# Patient Record
Sex: Female | Born: 1945 | Race: White | Hispanic: No | Smoking: Former smoker
Health system: Southern US, Community
[De-identification: ages and names within clinical notes are randomized; demographics above are authoritative.]

## PROBLEM LIST (undated history)

## (undated) DIAGNOSIS — I10 Essential (primary) hypertension: Secondary | ICD-10-CM

## (undated) DIAGNOSIS — R413 Other amnesia: Secondary | ICD-10-CM

## (undated) DIAGNOSIS — R2681 Unsteadiness on feet: Secondary | ICD-10-CM

## (undated) DIAGNOSIS — R519 Headache, unspecified: Secondary | ICD-10-CM

## (undated) DIAGNOSIS — R251 Tremor, unspecified: Secondary | ICD-10-CM

## (undated) DIAGNOSIS — J189 Pneumonia, unspecified organism: Secondary | ICD-10-CM

## (undated) HISTORY — PX: TONSILLECTOMY: SUR1361

## (undated) HISTORY — DX: Other amnesia: R41.3

## (undated) HISTORY — DX: Headache, unspecified: R51.9

## (undated) HISTORY — DX: Tremor, unspecified: R25.1

## (undated) HISTORY — DX: Unsteadiness on feet: R26.81

---

## 2011-01-13 ENCOUNTER — Encounter (HOSPITAL_COMMUNITY): Payer: Self-pay | Admitting: Pulmonary Disease

## 2011-01-13 ENCOUNTER — Emergency Department (HOSPITAL_COMMUNITY): Payer: Medicare Other

## 2011-01-13 ENCOUNTER — Other Ambulatory Visit: Payer: Self-pay

## 2011-01-13 ENCOUNTER — Inpatient Hospital Stay (HOSPITAL_COMMUNITY)
Admission: EM | Admit: 2011-01-13 | Discharge: 2011-01-23 | DRG: 193 | Disposition: A | Discharge status: Home / Self Care | Payer: Medicare Other | Attending: Internal Medicine | Admitting: Internal Medicine

## 2011-01-13 ENCOUNTER — Ambulatory Visit (INDEPENDENT_AMBULATORY_CARE_PROVIDER_SITE_OTHER): Payer: Medicare Other

## 2011-01-13 DIAGNOSIS — J9600 Acute respiratory failure, unspecified whether with hypoxia or hypercapnia: Secondary | ICD-10-CM

## 2011-01-13 DIAGNOSIS — E871 Hypo-osmolality and hyponatremia: Secondary | ICD-10-CM | POA: Diagnosis present

## 2011-01-13 DIAGNOSIS — J189 Pneumonia, unspecified organism: Secondary | ICD-10-CM

## 2011-01-13 DIAGNOSIS — I5031 Acute diastolic (congestive) heart failure: Secondary | ICD-10-CM

## 2011-01-13 DIAGNOSIS — E876 Hypokalemia: Secondary | ICD-10-CM | POA: Diagnosis present

## 2011-01-13 DIAGNOSIS — J159 Unspecified bacterial pneumonia: Secondary | ICD-10-CM

## 2011-01-13 DIAGNOSIS — R0902 Hypoxemia: Secondary | ICD-10-CM

## 2011-01-13 DIAGNOSIS — R000 Tachycardia, unspecified: Secondary | ICD-10-CM

## 2011-01-13 DIAGNOSIS — Z87891 Personal history of nicotine dependence: Secondary | ICD-10-CM

## 2011-01-13 DIAGNOSIS — I498 Other specified cardiac arrhythmias: Secondary | ICD-10-CM | POA: Diagnosis present

## 2011-01-13 DIAGNOSIS — J984 Other disorders of lung: Secondary | ICD-10-CM

## 2011-01-13 DIAGNOSIS — I509 Heart failure, unspecified: Secondary | ICD-10-CM | POA: Diagnosis present

## 2011-01-13 DIAGNOSIS — I10 Essential (primary) hypertension: Secondary | ICD-10-CM | POA: Diagnosis present

## 2011-01-13 HISTORY — DX: Essential (primary) hypertension: I10

## 2011-01-13 LAB — DIFFERENTIAL
Blasts: 0 %
Lymphocytes Relative: 13 % (ref 12–46)
Lymphs Abs: 0.6 10*3/uL — ABNORMAL LOW (ref 0.7–4.0)
Myelocytes: 0 %
Neutro Abs: 3.1 10*3/uL (ref 1.7–7.7)
Neutrophils Relative %: 56 % (ref 43–77)
Promyelocytes Absolute: 0 %

## 2011-01-13 LAB — POCT I-STAT, CHEM 8
BUN: 21 mg/dL (ref 6–23)
Calcium, Ion: 1.08 mmol/L — ABNORMAL LOW (ref 1.12–1.32)
Chloride: 91 mEq/L — ABNORMAL LOW (ref 96–112)
HCT: 50 % — ABNORMAL HIGH (ref 36.0–46.0)
Sodium: 130 mEq/L — ABNORMAL LOW (ref 135–145)
TCO2: 30 mmol/L (ref 0–100)

## 2011-01-13 LAB — POCT I-STAT TROPONIN I: Troponin i, poc: 0.1 ng/mL (ref 0.00–0.08)

## 2011-01-13 LAB — CBC
MCH: 31.1 pg (ref 26.0–34.0)
MCHC: 35.3 g/dL (ref 30.0–36.0)
RDW: 13.1 % (ref 11.5–15.5)

## 2011-01-13 LAB — APTT: aPTT: 33 seconds (ref 24–37)

## 2011-01-13 LAB — PROTIME-INR: INR: 1.16 (ref 0.00–1.49)

## 2011-01-13 LAB — MRSA PCR SCREENING: MRSA by PCR: NEGATIVE

## 2011-01-13 MED ORDER — IPRATROPIUM BROMIDE 0.02 % IN SOLN
0.5000 mg | RESPIRATORY_TRACT | Status: DC
  Administered 2011-01-13 – 2011-01-16 (×14): 0.5 mg via RESPIRATORY_TRACT
  Filled 2011-01-13 (×16): qty 2.5

## 2011-01-13 MED ORDER — ENOXAPARIN SODIUM 40 MG/0.4ML ~~LOC~~ SOLN
40.0000 mg | SUBCUTANEOUS | Status: DC
  Administered 2011-01-13 – 2011-01-22 (×9): 40 mg via SUBCUTANEOUS
  Filled 2011-01-13 (×12): qty 0.4

## 2011-01-13 MED ORDER — ALBUTEROL SULFATE (5 MG/ML) 0.5% IN NEBU
INHALATION_SOLUTION | RESPIRATORY_TRACT | Status: AC
  Administered 2011-01-13: 2.5 mg via RESPIRATORY_TRACT
  Filled 2011-01-13: qty 1

## 2011-01-13 MED ORDER — SODIUM CHLORIDE 0.9 % IV BOLUS (SEPSIS)
500.0000 mL | Freq: Once | INTRAVENOUS | Status: AC
  Administered 2011-01-13: 14:00:00 via INTRAVENOUS

## 2011-01-13 MED ORDER — ENOXAPARIN SODIUM 30 MG/0.3ML ~~LOC~~ SOLN
30.0000 mg | Freq: Two times a day (BID) | SUBCUTANEOUS | Status: DC
  Filled 2011-01-13 (×3): qty 0.3

## 2011-01-13 MED ORDER — DEXTROSE 5 % IV SOLN
500.0000 mg | INTRAVENOUS | Status: DC
  Administered 2011-01-13 – 2011-01-18 (×6): 500 mg via INTRAVENOUS
  Filled 2011-01-13 (×6): qty 500

## 2011-01-13 MED ORDER — PIPERACILLIN-TAZOBACTAM 3.375 G IVPB
3.3750 g | INTRAVENOUS | Status: AC
  Administered 2011-01-13: 3.375 g via INTRAVENOUS
  Filled 2011-01-13: qty 50

## 2011-01-13 MED ORDER — DEXTROSE 5 % IV SOLN
1.0000 g | INTRAVENOUS | Status: DC
  Administered 2011-01-14 – 2011-01-18 (×5): 1 g via INTRAVENOUS
  Filled 2011-01-13 (×6): qty 10

## 2011-01-13 MED ORDER — ALBUTEROL SULFATE (5 MG/ML) 0.5% IN NEBU
2.5000 mg | INHALATION_SOLUTION | Freq: Four times a day (QID) | RESPIRATORY_TRACT | Status: DC
  Administered 2011-01-13 – 2011-01-19 (×23): 2.5 mg via RESPIRATORY_TRACT
  Filled 2011-01-13 (×19): qty 0.5
  Filled 2011-01-13: qty 5.5
  Filled 2011-01-13 (×4): qty 0.5

## 2011-01-13 MED ORDER — SODIUM CHLORIDE 0.9 % IV SOLN
INTRAVENOUS | Status: DC
  Administered 2011-01-13 – 2011-01-14 (×2): via INTRAVENOUS

## 2011-01-13 MED ORDER — SODIUM CHLORIDE 0.9 % IV SOLN: 250.0000 mL | INTRAVENOUS | Status: DC | PRN

## 2011-01-13 MED ORDER — KCL IN DEXTROSE-NACL 20-5-0.9 MEQ/L-%-% IV SOLN
INTRAVENOUS | Status: DC
  Filled 2011-01-13 (×3): qty 1000

## 2011-01-13 MED ORDER — IOHEXOL 300 MG/ML  SOLN
100.0000 mL | Freq: Once | INTRAMUSCULAR | Status: AC | PRN
  Administered 2011-01-13: 100 mL via INTRAVENOUS

## 2011-01-13 NOTE — ED Notes (Signed)
Had shortness of breath intermittently on Monday, gradually worsened until today is short of breath all the time and worse with any exertion. Has has mild cough, scratchy throat, weakness, no appetite, but no fever. Went to Urgent care, sats on arrival there were 66% on room air,

## 2011-01-13 NOTE — H&P (Signed)
  PCCM ADMISSION NOTE  Date of admission: 12/14 Primary Provider: [name] Pt Profile: 38 yowf with no sig prior medical history admitted with acute resp failure due to CAP  LINES/TUBES: None  MICRO:  PCT 12/14 >> Blood X 2 (drawn after zosyn given) 12/14 >> Strep Ag 12/14 >> Legionella Ag 12/14 >>   ABX:  Ceftriaxone 12/14 >> Azithro 12/14 >>  PROPHYLAXIS:  DVT: Lovenox  SUP: not indicated   HPI: 38 yowf who usually enjoys excellent health presented initially to an Urgent Care Center with a 5 day hx of malaise, subjective fever, sore throat and 2 day hx of increasing dyspnea. She was transferred from the Urgent Care Center to [facility] ED by ambulance for further eval of severe dyspnea. Upon arrival, she was found to be in extremis and received oxygen by NRB and nebulized albuterol with substantial improvement. She remains tachypneic and marginally hypoxic on the NRB mask. Therefore, PCCM asked to admit the patient. She is fully alert and appropriate. She denies CP, hemoptysis, LE edema and calf tenderness. Until the onset of this illness, she was in her usual state of excellent health   PMH: Hypertension  MEDICATIONS:  Metoprolol XL 100 mg PO daily Multivitamin  History   Social History  . Marital Status: Widowed   Occupational History  . Not on file.   Social History Main Topics  . Smoking status: Minimal intermittent smoking - none X several months   Other Topics Concern  . Not on file   Social History Narrative    FH: Both parents deceased. + CAD (father), + AF (brother)  ROS - As per HPI. Otherwise negative  Filed Vitals:   01/13/11 1311 01/13/11 1321 01/13/11 1346  BP:  164/84   Pulse:  142   Temp:  100 F (37.8 C)   TempSrc:  Oral   Resp:  30   SpO2: 91% 88% 89%    EXAM:  Gen: Mild to mod distress. Alert and appropriate HEENT: Dry oral mucosa. Otherwise WNL Neck: - JVD, -TM, supple Lungs: Full BS, bibasilar rales and rhonchi. ? Pleural friction rub  on L Cardiovascular: Tachy, regular, no murmurs Abdomen: soft, NT, NABS Musculoskeletal: No C/C/E Neuro: fully intact Skin: No lesoins  DATA: CXR: Mild hyperinflation, bibasilar infiltrates c/w PNA  BMET    Component Value Date/Time   NA 130* 01/13/2011 1408   K 3.6 01/13/2011 1408   CL 91* 01/13/2011 1408   GLUCOSE 140* 01/13/2011 1408   BUN 21 01/13/2011 1408   CREATININE 0.90 01/13/2011 1408    CBC    Component Value Date/Time   WBC 4.8 01/13/2011 1340   RBC 5.05 01/13/2011 1340   HGB 17.0* 01/13/2011 1408   HCT 50.0* 01/13/2011 1408   PLT 163 01/13/2011 1340   MCV 88.1 01/13/2011 1340   MCH 31.1 01/13/2011 1340   MCHC 35.3 01/13/2011 1340   RDW 13.1 01/13/2011 1340   LYMPHSABS 0.6* 01/13/2011 1340   MONOABS 1.1* 01/13/2011 1340   EOSABS 0.0 01/13/2011 1340   BASOSABS 0.0 01/13/2011 1340     IMPRESSION:     Acute respiratory failure due to Community acquired bacterial pneumonia  Hypertension  Ex-smoker  Hyponatremia   PLAN:  ICU admission, high risk of intubation PRN BiPAP Cultures, strep and legionella antigens Ceftrx and Azithro IVFs ordered NPO until risk of intubation has passed DVT prophylaxis and SUP Full Code   Lisa Fischer, MD;  PCCM service; Mobile [phone]

## 2011-01-13 NOTE — ED Notes (Signed)
MD at bedside. Dr. Pickering. 

## 2011-01-13 NOTE — ED Provider Notes (Signed)
History     CSN: [id number] Arrival date & time: 01/13/2011  1:00 PM   First MD Initiated Contact with Patient 01/13/11 1323      Chief Complaint  Patient presents with  . Shortness of Breath    (Consider location/radiation/quality/duration/timing/severity/associated sxs/prior treatment) The history is provided by the patient.   patient has some mild shortness of breath earlier in the week. She has scratchy throat a mild cough that time. Today it is much worse. Shortness of breath all the time worse with exertion. No fevers. No chest pain. She's not had issues like this before. She did seen at urgent care and sent here for having sats of 66% on room air.   No past medical history on file.  No past surgical history on file.  No family history on file.  History  Substance Use Topics  . Smoking status: Not on file  . Smokeless tobacco: Not on file  . Alcohol Use: Not on file    OB History    No data available      Review of Systems  Constitutional: Positive for appetite change and fatigue. Negative for fever.  HENT: Positive for sore throat.   Respiratory: Positive for cough and shortness of breath.   Cardiovascular: Negative for chest pain.  Genitourinary: Negative for flank pain.  Musculoskeletal: Negative for myalgias.  Neurological: Negative for seizures.  Psychiatric/Behavioral: Negative for confusion.    Allergies  Review of patient's allergies indicates no known allergies.  Home Medications   Current Outpatient Rx  Name Route Sig Dispense Refill  . HYDROCHLOROTHIAZIDE 12.5 MG PO CAPS Oral Take 12.5 mg by mouth daily.      Marland Kitchen METOPROLOL SUCCINATE ER 100 MG PO TB24 Oral Take 100 mg by mouth daily.      Carma Leaven M PLUS PO TABS Oral Take 1 tablet by mouth daily.        BP 164/84  Pulse 142  Temp(Src) 100 F (37.8 C) (Oral)  Resp 30  SpO2 89%  Physical Exam  Nursing note and vitals reviewed. Constitutional: She is oriented to person, place, and time.  She appears well-developed and well-nourished.  HENT:  Head: Normocephalic and atraumatic.  Eyes: EOM are normal. Pupils are equal, round, and reactive to light.  Neck: Normal range of motion. Neck supple.  Cardiovascular: Normal rate, regular rhythm and normal heart sounds.   No murmur heard. Pulmonary/Chest: She is in respiratory distress. She has no wheezes. She has no rales.       Tachypnea and hypoxia  Abdominal: Soft. Bowel sounds are normal. She exhibits no distension. There is no tenderness. There is no rebound and no guarding.  Musculoskeletal: Normal range of motion.  Neurological: She is alert and oriented to person, place, and time. No cranial nerve deficit.  Skin: Skin is warm and dry.  Psychiatric: She has a normal mood and affect. Her speech is normal.    ED Course  Procedures (including critical care time)  Labs Reviewed  CBC - Abnormal; Notable for the following:    Hemoglobin 15.7 (*)    All other components within normal limits  POCT I-STAT, CHEM 8 - Abnormal; Notable for the following:    Sodium 130 (*)    Chloride 91 (*)    Glucose, Bld 140 (*)    Calcium, Ion 1.08 (*)    Hemoglobin 17.0 (*)    HCT 50.0 (*)    All other components within normal limits  POCT I-STAT TROPONIN I -  Abnormal; Notable for the following:    Troponin i, poc 0.10 (*)    All other components within normal limits  DIFFERENTIAL  PROTIME-INR  APTT  I-STAT, CHEM 8  I-STAT TROPONIN I   No results found.   1. CAP (community acquired pneumonia)   2. Hypoxia     Date: 01/13/2011  Rate: 137  Rhythm: sinus tachycardia  QRS Axis: normal  Intervals: normal  ST/T Wave abnormalities: nonspecific T wave changes  Conduction Disutrbances:none  Narrative Interpretation:   Old EKG Reviewed: none available  CRITICAL CARE Performed by: Billee Cashing   Total critical care time:35  Critical care time was exclusive of separately billable procedures and treating other  patients.  Critical care was necessary to treat or prevent imminent or life-threatening deterioration.  Critical care was time spent personally by me on the following activities: development of treatment plan with patient and/or surrogate as well as nursing, discussions with consultants, evaluation of patient's response to treatment, examination of patient, obtaining history from patient or surrogate, ordering and performing treatments and interventions, ordering and review of laboratory studies, ordering and review of radiographic studies, pulse oximetry and re-evaluation of patient's condition.   MDM  And shortness of breath and worsened today with milder for the last week. Severe hypoxia on room air moderate hypoxia not appreciated. Chest x-ray showed bilateral lower lobe infiltrates. Since it was somewhat acute onset of increased dyspnea CT and he was done which showed just the pneumonias. Patient continues to have some hypoxia. She is to be started on BiPAP. Her troponin is slightly above normal. She's not having chest pain. It is likely due to strain to to the hypoxia and respiratory issues. She will be admitted to the ICU I discussed with Dr. Molli Knock.        Juliet Rude. Rubin Payor, MD 01/13/11 1525

## 2011-01-13 NOTE — ED Notes (Signed)
WUJ:[id number]<JY> Expected date:01/13/11<BR> Expected time: 1:00 PM<BR> Means of arrival:Ambulance<BR> Comments:<BR> M33- 61yoF SOB, cough low sats 60% - 80% on NRB

## 2011-01-14 LAB — PROCALCITONIN: Procalcitonin: 16.46 ng/mL

## 2011-01-14 LAB — DIFFERENTIAL
Basophils Absolute: 0.1 10*3/uL (ref 0.0–0.1)
Lymphs Abs: 0.6 10*3/uL — ABNORMAL LOW (ref 0.7–4.0)
Monocytes Relative: 13 % — ABNORMAL HIGH (ref 3–12)
WBC Morphology: INCREASED

## 2011-01-14 LAB — BASIC METABOLIC PANEL
Calcium: 9.1 mg/dL (ref 8.4–10.5)
GFR calc Af Amer: >90 mL/min (ref >90)
GFR calc non Af Amer: >90 mL/min (ref >90)
Sodium: 132 mEq/L — ABNORMAL LOW (ref 135–145)

## 2011-01-14 LAB — CBC
MCV: 88.6 fL (ref 78.0–100.0)
Platelets: 172 10*3/uL (ref 150–400)
RDW: 13.3 % (ref 11.5–15.5)
WBC: 8.2 10*3/uL (ref 4.0–10.5)

## 2011-01-14 MED ORDER — BIOTENE DRY MOUTH MT LIQD
15.0000 mL | Freq: Two times a day (BID) | OROMUCOSAL | Status: DC
  Administered 2011-01-14 – 2011-01-23 (×18): 15 mL via OROMUCOSAL

## 2011-01-14 MED ORDER — POTASSIUM CHLORIDE CRYS ER 20 MEQ PO TBCR
40.0000 meq | EXTENDED_RELEASE_TABLET | Freq: Once | ORAL | Status: AC
  Administered 2011-01-14: 40 meq via ORAL
  Filled 2011-01-14: qty 2

## 2011-01-14 MED ORDER — CHLORHEXIDINE GLUCONATE 0.12 % MT SOLN
15.0000 mL | Freq: Two times a day (BID) | OROMUCOSAL | Status: DC
  Administered 2011-01-14 – 2011-01-15 (×3): 15 mL via OROMUCOSAL
  Filled 2011-01-14 (×6): qty 15

## 2011-01-14 NOTE — Progress Notes (Signed)
  PCCM ADMISSION NOTE  Date of admission: 12/14 Primary Provider: Cleta Alberts Pt Profile: 65 yowf, htn, ex smoker 20 Pyrs (quit 10y) admitted with acute resp failure due to BLL CAP. Improved with NIV  LINES/TUBES: None  MICRO:  PCT 12/14 >>12 --> 16 Blood X 2 (drawn after zosyn given) 12/14 >> Strep Ag 12/14 >>neg Legionella Ag 12/14 >> Flu panel >>   ABX:  Ceftriaxone 12/14 >> Azithro 12/14 >>  PROPHYLAXIS:  DVT: Lovenox  SUP: not indicated     Filed Vitals:   01/14/11 0400 01/14/11 0600 01/14/11 0640 01/14/11 0740  BP: 127/63 105/54    Pulse: 116 109 113   Temp: 99.4 F (37.4 C)     TempSrc: Axillary     Resp: 39 30 33   Height:      Weight: 52.5 kg (115 lb 11.9 oz)     SpO2: 94% 98% 92% 94%    EXAM:  Gen: Mild  distress. Alert and appropriate HEENT: Dry oral mucosa. Otherwise WNL Neck: - JVD, -TM, supple Lungs: Full BS, bibasilar rales , NO Pleural friction rub on L Cardiovascular: Tachy, regular, no murmurs Abdomen: soft, NT, NABS Musculoskeletal: No C/C/E Neuro: fully intact Skin: No lesoins  DATA: CXR: 12/14  Mild hyperinflation, bibasilar infiltrates c/w PNA  BMET    Component Value Date/Time   NA 132* 01/14/2011 0307   K 3.3* 01/14/2011 0307   CL 93* 01/14/2011 0307   CO2 29 01/14/2011 0307   GLUCOSE 116* 01/14/2011 0307   BUN 15 01/14/2011 0307   CREATININE 0.51 01/14/2011 0307   CALCIUM 9.1 01/14/2011 0307   GFRNONAA >90 01/14/2011 0307   GFRAA >90 01/14/2011 0307    CBC    Component Value Date/Time   WBC 8.2 01/14/2011 0307   RBC 4.73 01/14/2011 0307   HGB 14.5 01/14/2011 0307   HCT 41.9 01/14/2011 0307   PLT 172 01/14/2011 0307   MCV 88.6 01/14/2011 0307   MCH 30.7 01/14/2011 0307   MCHC 34.6 01/14/2011 0307   RDW 13.3 01/14/2011 0307   LYMPHSABS 0.6* 01/14/2011 0307   MONOABS 1.1* 01/14/2011 0307   EOSABS 0.0 01/14/2011 0307   BASOSABS 0.1 01/14/2011 0307     IMPRESSION:     Acute respiratory failure due to Community  acquired bacterial pneumonia - change to NRB & dial down FIO2, NIV prn only -ct ceftx/ azithro,a wait legionella ag & flu panel   Hypertension - monitor  Ex-smoker -will need spirometry in future once pneumonia resolve, may have underlying COPD   Hyponatremia/ hypokalemia -replete   PLAN:   Advance diet DVT prophylaxis and SUP Full Code  Updated family, stay in ICU until on low flow oxygen  Kaveon Blatz V.

## 2011-01-15 ENCOUNTER — Inpatient Hospital Stay (HOSPITAL_COMMUNITY): Payer: Medicare Other

## 2011-01-15 DIAGNOSIS — J159 Unspecified bacterial pneumonia: Secondary | ICD-10-CM

## 2011-01-15 DIAGNOSIS — J9600 Acute respiratory failure, unspecified whether with hypoxia or hypercapnia: Secondary | ICD-10-CM

## 2011-01-15 LAB — CBC
HCT: 36.7 % (ref 36.0–46.0)
Hemoglobin: 12.3 g/dL (ref 12.0–15.0)
MCH: 30.2 pg (ref 26.0–34.0)
MCHC: 33.5 g/dL (ref 30.0–36.0)

## 2011-01-15 LAB — BASIC METABOLIC PANEL
BUN: 13 mg/dL (ref 6–23)
Chloride: 98 mEq/L (ref 96–112)
Glucose, Bld: 103 mg/dL — ABNORMAL HIGH (ref 70–99)
Potassium: 3.9 mEq/L (ref 3.5–5.1)

## 2011-01-15 LAB — LEGIONELLA ANTIGEN, URINE

## 2011-01-15 MED ORDER — METOPROLOL TARTRATE 25 MG PO TABS
25.0000 mg | ORAL_TABLET | Freq: Two times a day (BID) | ORAL | Status: DC
  Administered 2011-01-15 (×2): 25 mg via ORAL
  Filled 2011-01-15 (×4): qty 1

## 2011-01-15 NOTE — Progress Notes (Signed)
  PCCM ADMISSION NOTE  Date of admission: 12/14 Primary Provider: Cleta Alberts Pt Profile: 65 yowf, htn, ex smoker 20 Pyrs (quit 10y) admitted with acute resp failure due to BLL CAP. Improved with NIV  LINES/TUBES: None  MICRO:  PCT 12/14 >>12 --> 16 Blood X 2 (drawn after zosyn given) 12/14 >>ng Strep Ag 12/14 >>neg Legionella Ag 12/14 >> Flu panel >>neg   ABX:  Ceftriaxone 12/14 >> Azithro 12/14 >>  PROPHYLAXIS:  DVT: Lovenox  SUP: not indicated   No CP, decreased dyspnea  Filed Vitals:   01/15/11 0700 01/15/11 0800 01/15/11 0810 01/15/11 0900  BP: 128/67   135/71  Pulse: 105 94  126  Temp:  99.2 F (37.3 C)    TempSrc:      Resp: 26 20  26   Height:      Weight:      SpO2: 94% 95% 95% 91%    EXAM:  Gen: Mild  distress. Alert and appropriate HEENT: Dry oral mucosa. Otherwise WNL Neck: - JVD, -TM, supple Lungs: Full BS, bibasilar rales , NO Pleural friction rub on L Cardiovascular: Tachy, regular, no murmurs Abdomen: soft, NT, NABS Musculoskeletal: No C/C/E Neuro: fully intact Skin: No lesoins  DATA: CXR: 12/14  Mild hyperinflation, bibasilar infiltrates c/w PNA  BMET    Component Value Date/Time   NA 132* 01/15/2011 0325   K 3.9 01/15/2011 0325   CL 98 01/15/2011 0325   CO2 27 01/15/2011 0325   GLUCOSE 103* 01/15/2011 0325   BUN 13 01/15/2011 0325   CREATININE 0.43* 01/15/2011 0325   CALCIUM 8.8 01/15/2011 0325   GFRNONAA >90 01/15/2011 0325   GFRAA >90 01/15/2011 0325    CBC    Component Value Date/Time   WBC 10.1 01/15/2011 0325   RBC 4.07 01/15/2011 0325   HGB 12.3 01/15/2011 0325   HCT 36.7 01/15/2011 0325   PLT 184 01/15/2011 0325   MCV 90.2 01/15/2011 0325   MCH 30.2 01/15/2011 0325   MCHC 33.5 01/15/2011 0325   RDW 13.6 01/15/2011 0325   LYMPHSABS 0.6* 01/14/2011 0307   MONOABS 1.1* 01/14/2011 0307   EOSABS 0.0 01/14/2011 0307   BASOSABS 0.1 01/14/2011 0307     IMPRESSION:     Acute respiratory failure due to Community  acquired bacterial pneumonia - change to [place] & dial down FIO2, dc  NIV  -flu panel neg -ct ceftx/ azithro,await legionella ag   Sinus tach - resume lopressor 25 bid - home dose 100  Hypertension - monitor  Ex-smoker -will need spirometry in future once pneumonia resolve, may have underlying COPD   Hyponatremia/ hypokalemia -repleted   PLAN:   Advance diet DVT prophylaxis and SUP Full Code  Updated family, OK to transfer to tele  ALVA,RAKESH V.

## 2011-01-15 NOTE — Plan of Care (Signed)
Problem: Consults Goal: Pneumonia Patient Education See Patient Educatio Module for education specifics.  Outcome: Progressing Educated on need to deep breathe and cough to get sputum up to clear lungs.  Pt had fever of 102 today, not treated with medication but decreased with deep breathing and coughing.

## 2011-01-15 NOTE — Progress Notes (Signed)
Pt has fever of 102. Talked to e-link dr who said fever would not be treated at this time.

## 2011-01-16 ENCOUNTER — Inpatient Hospital Stay (HOSPITAL_COMMUNITY): Payer: Medicare Other

## 2011-01-16 DIAGNOSIS — R000 Tachycardia, unspecified: Secondary | ICD-10-CM

## 2011-01-16 LAB — BASIC METABOLIC PANEL
BUN: 9 mg/dL (ref 6–23)
Calcium: 8.7 mg/dL (ref 8.4–10.5)
GFR calc non Af Amer: >90 mL/min (ref >90)
Glucose, Bld: 107 mg/dL — ABNORMAL HIGH (ref 70–99)

## 2011-01-16 LAB — CBC
Hemoglobin: 12.5 g/dL (ref 12.0–15.0)
MCH: 30 pg (ref 26.0–34.0)
MCHC: 33.7 g/dL (ref 30.0–36.0)
Platelets: 216 10*3/uL (ref 150–400)

## 2011-01-16 MED ORDER — METOPROLOL TARTRATE 50 MG PO TABS
50.0000 mg | ORAL_TABLET | Freq: Two times a day (BID) | ORAL | Status: DC
  Administered 2011-01-16 – 2011-01-23 (×15): 50 mg via ORAL
  Filled 2011-01-16 (×16): qty 1

## 2011-01-16 MED ORDER — IPRATROPIUM BROMIDE 0.02 % IN SOLN
0.5000 mg | Freq: Four times a day (QID) | RESPIRATORY_TRACT | Status: DC
  Administered 2011-01-17 – 2011-01-19 (×11): 0.5 mg via RESPIRATORY_TRACT
  Filled 2011-01-16 (×12): qty 2.5

## 2011-01-16 MED ORDER — SODIUM CHLORIDE 0.9 % IV SOLN: 250.0000 mL | INTRAVENOUS | Status: DC | PRN

## 2011-01-16 NOTE — Progress Notes (Signed)
  PCCM ADMISSION NOTE  Date of admission: 12/14 Primary Provider: Cleta Alberts Pt Profile: 65 yowf, htn, ex smoker 20 Pyrs (quit 10y) admitted with acute resp failure due to BLL CAP. Improved with NIV  LINES/TUBES: None  MICRO:  PCT 12/14 >>12 --> 16 Blood X 2 (drawn after zosyn given) 12/14 >>ng Strep Ag 12/14 >>neg Legionella Ag 12/14 >>neg Flu panel >>neg   ABX:  Ceftriaxone 12/14 >> Azithro 12/14 >>  PROPHYLAXIS:  DVT: Lovenox  SUP: not indicated  subjective No CP, decreased dyspnea, feeling better day by day.   BP 108/53  Pulse 102  Temp(Src) 98.4 F (36.9 C) (Oral)  Resp 24  Ht 5\' 5"  (1.651 m)  Wt 55.1 kg (121 lb 7.6 oz)  BMI 20.21 kg/m2  SpO2 93%   EXAM:  Gen:No distress.  Alert and appropriate HEENT: Dry oral mucosa. Otherwise WNL Neck: - JVD, -TM, supple Lungs: Full BS, bibasilar rales , NO Pleural friction rub on L Cardiovascular: Tachy, regular, no murmurs Abdomen: soft, NT, NABS Musculoskeletal: No C/C/E Neuro: fully intact Skin: No lesoins  DATA: CXR: 12/16  Mild hyperinflation, bibasilar infiltrates c/w PNA, decreased aeration when c/w initial film   Lab 01/16/11 0320 01/15/11 0325 01/14/11 0307  NA 129* 132* 132*  K 3.7 3.9 3.3*  CL 97 98 93*  CO2 24 27 29   BUN 9 13 15   CREATININE 0.38* 0.43* 0.51  GLUCOSE 107* 103* 116*    Lab 01/16/11 0320 01/15/11 0325 01/14/11 0307  HGB 12.5 12.3 14.5  HCT 37.1 36.7 41.9  WBC 13.7* 10.1 8.2  PLT 216 184 172      IMPRESSION:     Acute respiratory failure due to Community acquired bacterial pneumonia. Clinically feeling better, but Oxygen requirements remain high Plan: -recheck CXR today -cont pulm hygiene -ct ceftx/ azithro -mobilize  Sinus tachycardia, may be some degree of volume depletion contributing.  Plan: -increase IVFs -escalate bb to 50 bid (home dose 100)   Hypertension - monitor Plan: -cont b blocker   Ex-smoker -will need spirometry in future once pneumonia resolve, may  have underlying COPD   Hyponatremia: think this is volume related.   Lab 01/16/11 0320 01/15/11 0325 01/14/11 0307  NA 129* 132* 132*  plan: -increase NS -f/u am chemistry  Dispo/best practice Plan: -Advance diet -DVT prophylaxis and SUP -Full Code -Updated family, OK to transfer to tele  BABCOCK,PETE   STAFF NOTE: I, Dr Lavinia Sharps have personally reviewed patient's available data, including medical history, events of note, physical examination and test results as part of my evaluation. I have discussed with NP and other care providers such as pharmacist, RN and RRT.  In addition,  I personally evaluated patient and elicited key findings of improved respiratory failure and subjective improvement. She specifically told that she is 100% better and is improving day by day. I agree with plans to transfer to telemetry floor. I would keep patient on pulmonary service for the moment. Main issues today with her tachycardia and her hyponatremia which we are addressing per nurse practitioner. Depending on her sodium tomorrow might initiate workup for hyponatremia but the top differential diagnosis is SIADH versus volume depletion. Suspect the former because of resident of pneumonia. If sodium is getting worse tomorrow with fluid challenge then we'll hold off on fluids. Might need to check thyroid and adrenal function unction.  Rest per NP whose note is outlined above and that I agree with

## 2011-01-17 ENCOUNTER — Inpatient Hospital Stay (HOSPITAL_COMMUNITY): Payer: Medicare Other

## 2011-01-17 DIAGNOSIS — J159 Unspecified bacterial pneumonia: Secondary | ICD-10-CM

## 2011-01-17 DIAGNOSIS — J9600 Acute respiratory failure, unspecified whether with hypoxia or hypercapnia: Secondary | ICD-10-CM

## 2011-01-17 DIAGNOSIS — R000 Tachycardia, unspecified: Secondary | ICD-10-CM

## 2011-01-17 LAB — CULTURE, RESPIRATORY W GRAM STAIN: Special Requests: NORMAL

## 2011-01-17 LAB — CBC
MCV: 88.8 fL (ref 78.0–100.0)
Platelets: 257 10*3/uL (ref 150–400)
RDW: 14.1 % (ref 11.5–15.5)
WBC: 16.6 10*3/uL — ABNORMAL HIGH (ref 4.0–10.5)

## 2011-01-17 LAB — BASIC METABOLIC PANEL
Chloride: 97 mEq/L (ref 96–112)
Creatinine, Ser: 0.39 mg/dL — ABNORMAL LOW (ref 0.50–1.10)
GFR calc Af Amer: >90 mL/min (ref >90)
GFR calc non Af Amer: >90 mL/min (ref >90)

## 2011-01-17 LAB — MAGNESIUM: Magnesium: 2.2 mg/dL (ref 1.5–2.5)

## 2011-01-17 NOTE — Progress Notes (Signed)
PCCM ADMISSION NOTE  Date of admission: 12/14 Primary Provider: Cleta Alberts Pt Profile: 87 yowf, htn, patient of Dr Ellis Parents, ex smoker 20 Pyrs (quit 10y) with only medical problem of hypertension for which she took lopressor, but otherwise healthy and fully functional living alone. Admitted with acute resp failure due to BLL CAP following visit to funeral. Did not have flu shot this season   LINES/TUBES: NIV (never intubated)  MICRO:  MRSA PCR 12/14 - neg PCT 12/14 >>12  Blood X 2 (drawn after zosyn given) 12/14 >>ng Strep Ag 12/14 >>neg Legionella Ag 12/14 >>neg Flu panel >>neg .............. PCT 12/15 - 15 ...............Marland Kitchen 18 panel REsp virus 12/18 >> ............... PCT 12/19 >> Lactate 12/19 BNP 12/19 >>  ABX:  Ceftriaxone 12/14 >> Azithro 12/14 >> Anti-infectives     Start     Dose/Rate Route Frequency Ordered Stop   01/13/11 1730   azithromycin (ZITHROMAX) 500 mg in dextrose 5 % 250 mL IVPB        500 mg 250 mL/hr over 60 Minutes Intravenous Every 24 hours 01/13/11 1631     01/13/11 1700   cefTRIAXone (ROCEPHIN) 1 g in dextrose 5 % 50 mL IVPB        1 g 100 mL/hr over 30 Minutes Intravenous Every 24 hours 01/13/11 1631     01/13/11 1445  piperacillin-tazobactam (ZOSYN) IVPB 3.375 g       3.375 g 12.5 mL/hr over 240 Minutes Intravenous To Emergency Dept 01/13/11 1442 01/13/11 1935           PROPHYLAXIS:  DVT: Lovenox  SUP: not indicated  subjective Feels better. Been in bed. But feels improvement in plateau. Still o2 dependent. Wodnering how she fell so sick.   BP 117/72  Pulse 113  Temp(Src) 98.2 F (36.8 C) (Oral)  Resp 18  Ht 5\' 5"  (1.651 m)  Wt 55.747 kg (122 lb 14.4 oz)  BMI 20.45 kg/m2  SpO2 92%   EXAM:  Gen:No distress.  Alert and appropriate HEENT: Dry oral mucosa. Otherwise WNL Neck: - JVD, -TM, supple Lungs: Full BS, bibasilar rales , NO Pleural friction rub on L Cardiovascular: Tachy, regular, no murmurs Abdomen: soft, NT,  NABS Musculoskeletal: No C/C/E Neuro: fully intact Skin: No lesoins  DATA: CXR: 12/16  Mild hyperinflation, bibasilar infiltrates c/w PNA, decreased aeration when c/w initial film   Lab 01/17/11 0520 01/16/11 0320 01/15/11 0325  NA 131* 129* 132*  K 3.6 3.7 3.9  CL 97 97 98  CO2 25 24 27   BUN 11 9 13   CREATININE 0.39* 0.38* 0.43*  GLUCOSE 102* 107* 103*    Lab 01/17/11 0520 01/16/11 0320 01/15/11 0325  HGB 14.2 12.5 12.3  HCT 41.4 37.1 36.7  WBC 16.6* 13.7* 10.1  PLT 257 216 184   .CT 01/13/11 at admit   CXR today  - looks wet along with bilateral LL pna  IMPRESSION:     Acute respiratory failure due to Community acquired bacterial pneumonia. Clinically feeling better, but Oxygen requirements remain   Plan: -PT/OT consult -ct ceftx/ azithro -mobilize - check echo, pct and bnp - check 18 panel resp virus PCR  Sinus tachycardia, may be some degree of volume depletion contributing. Still persistent Plan: -increase IVFs -escalate bb to 50 bid (home dose 100 total) -    Hypertension - monitor Plan: -cont b blocker   Ex-smoker -will need spirometry in future once pneumonia resolve, may have underlying COPD   Hyponatremia: think this is volume related  and is improving  Lab 01/17/11 0520 01/16/11 0320 01/15/11 0325  NA 131* 129* 132*  plan: - NS -f/u am chemistry  Dispo/best practice Plan: -Advance diet -DVT prophylaxis and SUP -Full Code - continue tele - get PT/OT  Jadeyn Hargett

## 2011-01-18 DIAGNOSIS — I517 Cardiomegaly: Secondary | ICD-10-CM

## 2011-01-18 DIAGNOSIS — I5031 Acute diastolic (congestive) heart failure: Secondary | ICD-10-CM

## 2011-01-18 LAB — CBC
HCT: 36 % (ref 36.0–46.0)
Hemoglobin: 12.3 g/dL (ref 12.0–15.0)
MCH: 30.4 pg (ref 26.0–34.0)
MCV: 88.9 fL (ref 78.0–100.0)
RBC: 4.05 MIL/uL (ref 3.87–5.11)

## 2011-01-18 LAB — BASIC METABOLIC PANEL
CO2: 26 mEq/L (ref 19–32)
Calcium: 8.5 mg/dL (ref 8.4–10.5)
GFR calc Af Amer: >90 mL/min (ref >90)
GFR calc non Af Amer: >90 mL/min (ref >90)
Sodium: 130 mEq/L — ABNORMAL LOW (ref 135–145)

## 2011-01-18 LAB — PROCALCITONIN: Procalcitonin: 1.87 ng/mL

## 2011-01-18 MED ORDER — POTASSIUM CHLORIDE CRYS ER 20 MEQ PO TBCR
40.0000 meq | EXTENDED_RELEASE_TABLET | Freq: Two times a day (BID) | ORAL | Status: DC
  Administered 2011-01-18 – 2011-01-20 (×4): 40 meq via ORAL
  Filled 2011-01-18 (×7): qty 2

## 2011-01-18 MED ORDER — MOXIFLOXACIN HCL 400 MG PO TABS
400.0000 mg | ORAL_TABLET | Freq: Every day | ORAL | Status: DC
  Administered 2011-01-18 – 2011-01-22 (×5): 400 mg via ORAL
  Filled 2011-01-18 (×6): qty 1

## 2011-01-18 MED ORDER — FUROSEMIDE 10 MG/ML IJ SOLN
20.0000 mg | Freq: Two times a day (BID) | INTRAMUSCULAR | Status: DC
  Administered 2011-01-18 – 2011-01-19 (×2): 20 mg via INTRAVENOUS
  Filled 2011-01-18 (×5): qty 2

## 2011-01-18 NOTE — Progress Notes (Signed)
PCCM ADMISSION NOTE  Date of admission: 12/14 Primary Provider: Cleta Alberts Pt Profile: 81 yowf, htn, patient of Dr Ellis Parents, ex smoker 20 Pyrs (quit 10y) with only medical problem of hypertension for which she took lopressor, but otherwise healthy and fully functional living alone. Admitted with acute resp failure due to BLL CAP following visit to funeral. Did not have flu shot this season   LINES/TUBES: NIV (never intubated)  MICRO:  MRSA PCR 12/14 - neg PCT 12/14 >>12  Blood X 2 (drawn after zosyn given) 12/14 >>ng Strep Ag 12/14 >>neg Legionella Ag 12/14 >>neg Flu panel >>neg .............. PCT 12/15 - 15 ...............Marland Kitchen 18 panel REsp virus 12/18 >> ............... PCT 12/19 >>  1.9 Lactate 12/19 BNP 12/19 >> 1120 Trop < 0.3 ECHO 12./19 - ef 55% with inferior wall hypokinesis  ABX:  Ceftriaxone 12/14 >> 12/19 Azithro 12/14 >>12/19 AVelox 12/19 >> Anti-infectives     Start     Dose/Rate Route Frequency Ordered Stop   01/13/11 1730   azithromycin (ZITHROMAX) 500 mg in dextrose 5 % 250 mL IVPB        500 mg 250 mL/hr over 60 Minutes Intravenous Every 24 hours 01/13/11 1631     01/13/11 1700   cefTRIAXone (ROCEPHIN) 1 g in dextrose 5 % 50 mL IVPB        1 g 100 mL/hr over 30 Minutes Intravenous Every 24 hours 01/13/11 1631     01/13/11 1445  piperacillin-tazobactam (ZOSYN) IVPB 3.375 g       3.375 g 12.5 mL/hr over 240 Minutes Intravenous To Emergency Dept 01/13/11 1442 01/13/11 1935           PROPHYLAXIS:  DVT: Lovenox  SUP: not indicated  subjective Feels better. Been in bed. But feels improvement in plateau. Still o2 dependent. RN says she desaturates easily  BP 107/58  Pulse 103  Temp(Src) 98.3 F (36.8 C) (Oral)  Resp 20  Ht 5\' 5"  (1.651 m)  Wt 55.5 kg (122 lb 5.7 oz)  BMI 20.36 kg/m2  SpO2 95%   EXAM:  Gen:No distress.  Alert and appropriate HEENT: Dry oral mucosa. Otherwise WNL Neck: - JVD, -TM, supple Lungs: Full BS, bibasilar rales , NO  Pleural friction rub on L Cardiovascular: Tachy, regular, no murmurs Abdomen: soft, NT, NABS Musculoskeletal: No C/C/E Neuro: fully intact Skin: No lesoins  DATA: CXR: 12/16  Mild hyperinflation, bibasilar infiltrates c/w PNA, decreased aeration when c/w initial film   Lab 01/18/11 0520 01/17/11 0520 01/16/11 0320  NA 130* 131* 129*  K 3.6 3.6 3.7  CL 98 97 97  CO2 26 25 24   BUN 10 11 9   CREATININE 0.36* 0.39* 0.38*  GLUCOSE 96 102* 107*    Lab 01/18/11 0520 01/17/11 0520 01/16/11 0320  HGB 12.3 14.2 12.5  HCT 36.0 41.4 37.1  WBC 12.8* 16.6* 13.7*  PLT 285 257 216   .CT 01/13/11 at admit   CXR today  - looks wet along with bilateral LL pna  ECHO - There is hypokinesis of the base/mid inferolateral wall. The cavity size was normal. Wall thickness was normal. The estimated ejection fraction was 55%.     IMPRESSION:     Acute respiratory failure due to Community acquired bacterial pneumonia. 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 DIASTOLIC v IHD - diurese - recheck enzymes and bnp - consider cards consult depending on progress  Dispo/best practice Plan: -Advance diet -DVT prophylaxis and SUP -Full Code - continue tele - get PT/OT  Lisa Mcknight

## 2011-01-18 NOTE — Progress Notes (Signed)
  Echocardiogram 2D Echocardiogram has been performed.  Juanita Laster Milana Salay, RDCS 01/18/2011, 9:46 AM

## 2011-01-19 DIAGNOSIS — I5031 Acute diastolic (congestive) heart failure: Secondary | ICD-10-CM

## 2011-01-19 DIAGNOSIS — J159 Unspecified bacterial pneumonia: Secondary | ICD-10-CM

## 2011-01-19 DIAGNOSIS — R000 Tachycardia, unspecified: Secondary | ICD-10-CM

## 2011-01-19 DIAGNOSIS — J9600 Acute respiratory failure, unspecified whether with hypoxia or hypercapnia: Secondary | ICD-10-CM

## 2011-01-19 LAB — BASIC METABOLIC PANEL
BUN: 10 mg/dL (ref 6–23)
Chloride: 99 mEq/L (ref 96–112)
Creatinine, Ser: 0.4 mg/dL — ABNORMAL LOW (ref 0.50–1.10)
GFR calc Af Amer: >90 mL/min (ref >90)
Glucose, Bld: 91 mg/dL (ref 70–99)

## 2011-01-19 MED ORDER — ASPIRIN EC 325 MG PO TBEC
325.0000 mg | DELAYED_RELEASE_TABLET | Freq: Every day | ORAL | Status: DC
  Administered 2011-01-19 – 2011-01-23 (×5): 325 mg via ORAL
  Filled 2011-01-19 (×5): qty 1

## 2011-01-19 MED ORDER — ALBUTEROL SULFATE (5 MG/ML) 0.5% IN NEBU: 2.5000 mg | INHALATION_SOLUTION | RESPIRATORY_TRACT | Status: DC | PRN

## 2011-01-19 MED ORDER — FUROSEMIDE 10 MG/ML IJ SOLN
40.0000 mg | Freq: Two times a day (BID) | INTRAMUSCULAR | Status: DC
  Administered 2011-01-19 – 2011-01-20 (×3): 40 mg via INTRAVENOUS
  Filled 2011-01-19 (×5): qty 4

## 2011-01-19 NOTE — Progress Notes (Signed)
PCCM ADMISSION NOTE  Date of admission: 12/14 Primary Provider: [name] Pt Profile: 35 yowf, htn, patient of Dr Ellis Parents, ex smoker 20 Pyrs (quit 10y) with only medical problem of hypertension for which she took lopressor, but otherwise healthy and fully functional living alone. Admitted with acute resp failure due to BLL CAP following visit to funeral. Did not have flu shot this season   LINES/TUBES: NIV (never intubated)  MICRO:  MRSA PCR 12/14 - neg PCT 12/14 >>12  Blood X 2 (drawn after zosyn given) 12/14 >>ng Strep Ag 12/14 >>neg Legionella Ag 12/14 >>neg Flu panel >>neg .............. PCT 12/15 - 15 ...............Marland Kitchen 18 panel REsp virus 12/18 >> ............... PCT 12/19 >>  1.9 Lactate 12/19 BNP 12/19 >> 1120 Trop < 0.3 ECHO 12./19 - ef 55% with inferior wall hypokinesis  ABX:  Ceftriaxone 12/14 >> 12/19 Azithro 12/14 >>12/19 AVelox 12/19 >> Anti-infectives     Start     Dose/Rate Route Frequency Ordered Stop   01/18/11 2100   moxifloxacin (AVELOX) tablet 400 mg        400 mg Oral Daily 01/18/11 1854     01/13/11 1730   azithromycin (ZITHROMAX) 500 mg in dextrose 5 % 250 mL IVPB  Status:  Discontinued        500 mg 250 mL/hr over 60 Minutes Intravenous Every 24 hours 01/13/11 1631 01/18/11 1854   01/13/11 1700   cefTRIAXone (ROCEPHIN) 1 g in dextrose 5 % 50 mL IVPB  Status:  Discontinued        1 g 100 mL/hr over 30 Minutes Intravenous Every 24 hours 01/13/11 1631 01/18/11 1854   01/13/11 1445  piperacillin-tazobactam (ZOSYN) IVPB 3.375 g       3.375 g 12.5 mL/hr over 240 Minutes Intravenous To Emergency Dept 01/13/11 1442 01/13/11 1935           PROPHYLAXIS:  DVT: Lovenox  SUP: not indicated  subjective Feels better. . Still o2 dependent but down to 2L [place]. Ambulated with PT. Feesl stronger. Son from out of town at bedsidey  BP 118/57  Pulse 104  Temp(Src) 98 F (36.7 C) (Oral)  Resp 18  Ht 5\' 5"  (1.651 m)  Wt 54.8 kg (120 lb 13 oz)  BMI 20.10  kg/m2  SpO2 93%   EXAM:  Gen:No distress.  Alert and appropriate HEENT: Dry oral mucosa. Otherwise WNL Neck: - JVD, -TM, supple Lungs: Full BS, bibasilar rales , NO Pleural friction rub on L Cardiovascular: Tachy, regular, no murmurs Abdomen: soft, NT, NABS Musculoskeletal: No C/C/E Neuro: fully intact Skin: No lesoins  DATA: CXR: 12/16  Mild hyperinflation, bibasilar infiltrates c/w PNA, decreased aeration when c/w initial film   Lab 01/19/11 0452 01/18/11 0520 01/17/11 0520  NA 134* 130* 131*  K 4.2 3.6 3.6  CL 99 98 97  CO2 26 26 25   BUN 10 10 11   CREATININE 0.40* 0.36* 0.39*  GLUCOSE 91 96 102*    Lab 01/18/11 0520 01/17/11 0520 01/16/11 0320  HGB 12.3 14.2 12.5  HCT 36.0 41.4 37.1  WBC 12.8* 16.6* 13.7*  PLT 285 257 216   .CT 01/13/11 at admit   CXR today  - looks wet along with bilateral LL pna  ECHO - There is hypokinesis of the base/mid inferolateral wall. The cavity size was normal. Wall thickness was normal. The estimated ejection fraction was 55%.     IMPRESSION:     Acute respiratory failure due to Community acquired bacterial pneumonia. Clinically feeling better  adn afebrile x 48h, sepsis biomarkers better but Oxygen requirements remain ; suspect due to volume overload and diastolic dysfunction  Plan: -PT/OT consult appreciated -po avelox -mobilize  Sinus tachycardia,  Still persistent but better  Plan: metoprolol bb at 50 bid (home dose 100 total) to continue -    Hypertension - monitor Plan: -cont b blocker   Ex-smoker -will need spirometry in future once pneumonia resolve, may have underlying COPD   Hyponatremia: this improved after dc of IV fluids and start diuresis on 12/19 Lab 01/19/11 0452 01/18/11 0520 01/17/11 0520  NA 134* 130* 131*  plan: -dc  NS -f/u am chemistry  CHF - ? DIASTOLIC v IHD - diurese - recheck  bnp -  cards consult to be called 12/21  Dispo/best practice Plan: -Advance diet -DVT prophylaxis and  SUP -Full Code - continue tele - get PT/OT - call cards consult 12/21; home next few days depending on cards  [facility] Mcknight

## 2011-01-19 NOTE — Progress Notes (Signed)
Physical Therapy Evaluation Patient Details Name: Lisa Mcknight MRN: [id number] DOB: 1945/08/26 Today's Date: 01/19/2011 10:35-11:07 PT Eval 2 Problem List:  Patient Active Problem List  Diagnoses  . Hypertension  . Acute respiratory failure  . Community acquired bacterial pneumonia  . Ex-smoker  . Hyponatremia    Past Medical History:  Past Medical History  Diagnosis Date  . Hypertension    Past Surgical History: No past surgical history on file.  PT Assessment/Plan/Recommendation PT Assessment Clinical Impression Statement: Pt is very pleasant and motivated to return to prior level of function. She stated she walked 1.5 miles several times/week PTA. Sons can provide needed assist upon DC. May need home O2, pt desaturated to 83% on RA at rest. PT Recommendation/Assessment: Patient will need skilled PT in the acute care venue PT Problem List: Decreased activity tolerance Barriers to Discharge: None PT Therapy Diagnosis : Generalized weakness PT Plan PT Frequency: Min 3X/week PT Treatment/Interventions: Gait training;Therapeutic activities;Therapeutic exercise;Patient/family education PT Recommendation Recommendations for Other Services: OT consult (energy conservation) Follow Up Recommendations: None Equipment Recommended: Other (comment) (O2) PT Goals  Acute Rehab PT Goals PT Goal Formulation: With patient/family Time For Goal Achievement: 7 days Pt will Ambulate: >150 feet;Independently (SaO2 levels greater than 90%) PT Goal: Ambulate - Progress: Not met  PT Evaluation Precautions/Restrictions  Precautions Precautions:  (monitor O2 sats) Restrictions Weight Bearing Restrictions: No Prior Functioning  Home Living Lives With: Alone (3 sons available prn upon DC) Receives Help From: Family (sons can provide 24 hour assist) Type of Home: House Home Layout: One level Home Access: Stairs to enter [employer] of Steps: 1 Prior Function Level of  Independence: Independent with basic ADLs;Independent with homemaking with ambulation;Independent with gait Able to Take Stairs?: Yes Vocation: Retired Financial risk analyst Arousal/Alertness: Awake/alert Overall Cognitive Status: Appears within functional limits for tasks assessed Sensation/Coordination Sensation Light Touch: Appears Intact Coordination Gross Motor Movements are Fluid and Coordinated: Yes Fine Motor Movements are Fluid and Coordinated: Yes Extremity Assessment RUE Assessment RUE Assessment: Within Functional Limits LUE Assessment LUE Assessment: Within Functional Limits RLE Assessment RLE Assessment: Within Functional Limits LLE Assessment LLE Assessment: Within Functional Limits Mobility (including Balance) Bed Mobility Bed Mobility: Yes Supine to Sit: 6: Modified independent (Device/Increase time);With rails;HOB flat Transfers Transfers: Yes Sit to Stand: 6: Modified independent (Device/Increase time);From bed;With armrests;With upper extremity assist Stand to Sit: 6: Modified independent (Device/Increase time);To chair/3-in-1;With upper extremity assist;With armrests Ambulation/Gait Ambulation/Gait: Yes Ambulation/Gait Assistance: 6: Modified independent (Device/Increase time) Ambulation Distance (Feet): 120 Feet Assistive device:  (portable oxygen) Gait Pattern: Step-through pattern;Within Functional Limits    Exercise    End of Session PT - End of Session Equipment Utilized During Treatment: Gait belt Activity Tolerance: Patient limited by fatigue;Treatment limited secondary to medical complications (Comment) (O2 desaturation) Patient left: in chair;with call bell in reach;with family/visitor present Nurse Communication: Mobility status for ambulation;Mobility status for transfers;Other (comment) (O2 desaturation, recommended incentive spirometer) General Behavior During Session: [facility] for tasks performed Cognition: [facility] for tasks  performed  Tamala Ser 01/19/2011, 12:04 PM Tamala Ser PT 01/19/2011  [phone]

## 2011-01-20 ENCOUNTER — Encounter (HOSPITAL_COMMUNITY): Payer: Self-pay | Admitting: Cardiology

## 2011-01-20 DIAGNOSIS — I5031 Acute diastolic (congestive) heart failure: Secondary | ICD-10-CM

## 2011-01-20 DIAGNOSIS — I509 Heart failure, unspecified: Secondary | ICD-10-CM

## 2011-01-20 LAB — BASIC METABOLIC PANEL
CO2: 27 mEq/L (ref 19–32)
Calcium: 8.5 mg/dL (ref 8.4–10.5)
Chloride: 98 mEq/L (ref 96–112)
Glucose, Bld: 92 mg/dL (ref 70–99)
Potassium: 4.5 mEq/L (ref 3.5–5.1)
Sodium: 132 mEq/L — ABNORMAL LOW (ref 135–145)

## 2011-01-20 LAB — MAGNESIUM: Magnesium: 2.5 mg/dL (ref 1.5–2.5)

## 2011-01-20 LAB — CULTURE, BLOOD (ROUTINE X 2): Culture: NO GROWTH

## 2011-01-20 LAB — PRO B NATRIURETIC PEPTIDE: Pro B Natriuretic peptide (BNP): 642 pg/mL — ABNORMAL HIGH (ref 0–125)

## 2011-01-20 MED ORDER — LEVALBUTEROL HCL 0.63 MG/3ML IN NEBU
0.6300 mg | INHALATION_SOLUTION | Freq: Four times a day (QID) | RESPIRATORY_TRACT | Status: DC | PRN
  Filled 2011-01-20: qty 3

## 2011-01-20 MED ORDER — FUROSEMIDE 8 MG/ML PO SOLN
40.0000 mg | Freq: Every day | ORAL | Status: DC
  Administered 2011-01-22 – 2011-01-23 (×2): 40 mg via ORAL
  Filled 2011-01-20 (×7): qty 5

## 2011-01-20 MED ORDER — POTASSIUM CHLORIDE CRYS ER 20 MEQ PO TBCR
30.0000 meq | EXTENDED_RELEASE_TABLET | Freq: Once | ORAL | Status: AC
  Administered 2011-01-20: 30 meq via ORAL
  Filled 2011-01-20 (×3): qty 1

## 2011-01-20 NOTE — Progress Notes (Signed)
PCCM PROGRESS NOTE  Date of admission: 12/14 Primary Provider: [name] Pt Profile: 14 yowf, htn, patient of Dr Ellis Parents, ex smoker 20 Pyrs (quit 10y) with only medical problem of hypertension for which she took lopressor, but otherwise healthy and fully functional living alone though sginificant family hx of CAD. Admitted with acute resp failure due to BLL CAP following visit to funeral. Did not have flu shot this season   LINES/TUBES: NIV (never intubated)  MICRO:  MRSA PCR 12/14 - neg PCT 12/14 >>12  Blood X 2 (drawn after zosyn given) 12/14 >>ng Strep Ag 12/14 >>neg Legionella Ag 12/14 >>neg Flu panel >>neg .............. PCT 12/15 - 15 ...............Marland Kitchen 18 panel REsp virus 12/18 >> ............... PCT 12/19 >>  1.9 Lactate 12/19 BNP 12/19 >> 1120 Trop < 0.3 ECHO 12./19 - ef 55% with inferior wall hypokinesis  ABX:  Ceftriaxone 12/14 >> 12/19 Azithro 12/14 >>12/19 AVelox 12/19 >> (12/24) Anti-infectives     Start     Dose/Rate Route Frequency Ordered Stop   01/18/11 2100   moxifloxacin (AVELOX) tablet 400 mg        400 mg Oral Daily 01/18/11 1854     01/13/11 1730   azithromycin (ZITHROMAX) 500 mg in dextrose 5 % 250 mL IVPB  Status:  Discontinued        500 mg 250 mL/hr over 60 Minutes Intravenous Every 24 hours 01/13/11 1631 01/18/11 1854   01/13/11 1700   cefTRIAXone (ROCEPHIN) 1 g in dextrose 5 % 50 mL IVPB  Status:  Discontinued        1 g 100 mL/hr over 30 Minutes Intravenous Every 24 hours 01/13/11 1631 01/18/11 1854   01/13/11 1445  piperacillin-tazobactam (ZOSYN) IVPB 3.375 g       3.375 g 12.5 mL/hr over 240 Minutes Intravenous To Emergency Dept 01/13/11 1442 01/13/11 1935           PROPHYLAXIS:  DVT: Lovenox  SUP: not indicated  subjective Continues to feel better. Appetite improved. EAting. In pm: pulse ox 88% on RA. On 2L  walked > 57feet and pulse ox 91 -> 88%. HR 120s. Diuresis continues. BNP now in 600s  BP 109/85  Pulse 95  Temp(Src)  98.2 F (36.8 C) (Oral)  Resp 18  Ht 5\' 5"  (1.651 m)  Wt 120 lb 13 oz (54.8 kg)  BMI 20.10 kg/m2  SpO2 93%   EXAM:  Gen:No distress.  Alert and appropriate HEENT: Dry oral mucosa. Otherwise WNL Neck: - JVD, -TM, supple Lungs: Full BS, bibasilar rales that are improved , NO Pleural friction rub on L Cardiovascular: Tachy, regular, no murmurs Abdomen: soft, NT, NABS Musculoskeletal: No C/C/E Neuro: fully intact Skin: No lesoins  DATA: CXR: 12/16  Mild hyperinflation, bibasilar infiltrates c/w PNA, decreased aeration when c/w initial film   Lab 01/20/11 0030 01/19/11 0452 01/18/11 0520  NA 132* 134* 130*  K 4.5 4.2 3.6  CL 98 99 98  CO2 27 26 26   BUN 13 10 10   CREATININE 0.47* 0.40* 0.36*  GLUCOSE 92 91 96    Lab 01/18/11 0520 01/17/11 0520 01/16/11 0320  HGB 12.3 14.2 12.5  HCT 36.0 41.4 37.1  WBC 12.8* 16.6* 13.7*  PLT 285 257 216   .CT 01/13/11 at admit   CXR today  - looks wet along with bilateral LL pna  ECHO - There is hypokinesis of the base/mid inferolateral wall. The cavity size was normal. Wall thickness was normal. The estimated ejection fraction was 55%.  IMPRESSION:     Acute respiratory failure due to Community acquired bacterial pneumonia. Clinically feeling better adn afebrile x 48h, sepsis biomarkers better but Oxygen requirements remain ; suspect due to volume overload and diastolic dysfunction  Plan: -PT/OT consult appreciated -po avelox through 12/24 -mobilize to continue - IS  Sinus tachycardia,   Better. HR at rest 95 but with minimal exertion is 125.   Plan: metoprolol bb at 50 bid (home dose 100 total) to continue - no further recs by cards but needs caards fu   Hypertension - monitor. BP 109 sbp on [place] Plan: -cont b blocker   Ex-smoker -will need spirometry in future once pneumonia resolve, may have underlying COPD   Hyponatremia: this improved after dc of IV fluids and start diuresis on 12/19  Lab 01/20/11  0030 01/19/11 0452 01/18/11 0520  NA 132* 134* 130*  plan: -f/u am chemistry  CHF - ? DIASTOLIC v IHD - imprpved bnp 161W - diurese but change to po lasix - cards consult 12/21 appreciated: they want to do opd stress test - recheck  bnp -  cards consult  called 12/21  Dispo/best practice Plan: -Advance diet -DVT prophylaxis and SUP -Full Code - DC TELE - appreciate  PT/OT - d/w patient and sons about dc plan: she lives alone. They are uncomfortable about going home on oxygen but feel if she is stable then they will go home on o2 on 12/23 or 12/24 with o2. Needs cards and pulm fu. If hypxoemia unimproved despite diuresis, consider boop - next lab check 12/23  M Lamerle Jabs 01/20/2011, 1:19 PM

## 2011-01-20 NOTE — Consult Note (Addendum)
HPI: 65 year old female with no prior cardiac history admitted with pneumonia for evaluation of possible congestive heart failure. The patient was admitted on December 14 with recent complaints of subjective fever, sore throat and dyspnea. She was noted to be febrile. Chest CT on December 14 showed bilateral pneumonia primarily involving the lower lobes. Echocardiogram revealed an ejection fraction of 55% with basal/mid inferolateral hypokinesis. Patient's troponin normal. BNP today is 642. She has been treated with antibiotics with slow improvement. There is a question of whether diastolic congestive heart failure may be contributing. Cardiology is therefore asked to further evaluate. Prior to her pneumonia the patient denied dyspnea on exertion, orthopnea, PND, pedal edema, exertional chest pain or syncope. She also now has a productive cough of whitish sputum.  Medications Prior to Admission  Medication Dose Route Frequency Provider Last Rate Last Dose  . antiseptic oral rinse (BIOTENE) solution 15 mL  15 mL Mouth Rinse q12n4p Cidney Hulett   15 mL at 01/20/11 1200  . aspirin EC tablet 325 mg  325 mg Oral Daily Kalman Shan, MD   325 mg at 01/20/11 1037  . enoxaparin (LOVENOX) injection 40 mg  40 mg Subcutaneous Q24H Billy Fischer, MD   40 mg at 01/19/11 2146  . furosemide (LASIX) injection 40 mg  40 mg Intravenous q12n4p Kalman Shan, MD   40 mg at 01/20/11 0933  . iohexol (OMNIPAQUE) 300 MG/ML solution 100 mL  100 mL Intravenous Once PRN Juliet Rude. Pickering, MD   100 mL at 01/13/11 1434  . levalbuterol (XOPENEX) nebulizer solution 0.63 mg  0.63 mg Nebulization Q6H PRN Vilinda Blanks Minor, NP      . metoprolol (LOPRESSOR) tablet 50 mg  50 mg Oral BID Anders Simmonds, NP   50 mg at 01/20/11 1038  . moxifloxacin (AVELOX) tablet 400 mg  400 mg Oral Daily Kalman Shan, MD   400 mg at 01/19/11 2145  . piperacillin-tazobactam (ZOSYN) IVPB 3.375 g  3.375 g Intravenous To ER Juliet Rude. Pickering, MD    3.375 g at 01/13/11 1535  . potassium chloride SA (K-DUR,KLOR-CON) CR tablet 40 mEq  40 mEq Oral Once Rakesh V. Vassie Loll, MD   40 mEq at 01/14/11 1052  . potassium chloride SA (K-DUR,KLOR-CON) CR tablet 40 mEq  40 mEq Oral BID Kalman Shan, MD   40 mEq at 01/20/11 1038  . sodium chloride 0.9 % bolus 500 mL  500 mL Intravenous Once Harrold Donath R. Pickering, MD      . DISCONTD: 0.9 %  sodium chloride infusion   Intravenous Continuous Rakesh V. Vassie Loll, MD 75 mL/hr at 01/14/11 1348    . DISCONTD: 0.9 %  sodium chloride infusion  250 mL Intravenous PRN Billy Fischer, MD 10 mL/hr at 01/15/11 1228 250 mL at 01/15/11 1228  . DISCONTD: 0.9 %  sodium chloride infusion  250 mL Intravenous PRN Anders Simmonds, NP      . DISCONTD: albuterol (PROVENTIL) (5 MG/ML) 0.5% nebulizer solution 2.5 mg  2.5 mg Nebulization Q6H Billy Fischer, MD   2.5 mg at 01/19/11 1551  . DISCONTD: albuterol (PROVENTIL) (5 MG/ML) 0.5% nebulizer solution 2.5 mg  2.5 mg Nebulization Q2H PRN Kalman Shan, MD      . DISCONTD: azithromycin (ZITHROMAX) 500 mg in dextrose 5 % 250 mL IVPB  500 mg Intravenous Q24H Billy Fischer, MD   500 mg at 01/18/11 1722  . DISCONTD: cefTRIAXone (ROCEPHIN) 1 g in dextrose 5 % 50 mL IVPB  1 g Intravenous Q24H Billy Fischer, MD  1 g at 01/18/11 1721  . DISCONTD: chlorhexidine (PERIDEX) 0.12 % solution 15 mL  15 mL Mouth Rinse BID Cidney Hulett   15 mL at 01/15/11 1952  . DISCONTD: dextrose 5 % and 0.9 % NaCl with KCl 20 mEq/L infusion   Intravenous Continuous Billy Fischer, MD      . DISCONTD: enoxaparin (LOVENOX) injection 30 mg  30 mg Subcutaneous Q12H Billy Fischer, MD      . DISCONTD: furosemide (LASIX) injection 20 mg  20 mg Intravenous Q12H Kalman Shan, MD   20 mg at 01/19/11 0806  . DISCONTD: ipratropium (ATROVENT) nebulizer solution 0.5 mg  0.5 mg Nebulization Q4H Billy Fischer, MD   0.5 mg at 01/16/11 1222  . DISCONTD: ipratropium (ATROVENT) nebulizer solution 0.5 mg  0.5 mg Nebulization Q6H Anders Simmonds, NP   0.5 mg at 01/19/11 1551  . DISCONTD: metoprolol tartrate (LOPRESSOR) tablet 25 mg  25 mg Oral BID Rakesh V. Vassie Loll, MD   25 mg at 01/15/11 2134   No current outpatient prescriptions on file as of 01/20/2011.    No Known Allergies  Past Medical History  Diagnosis Date  . Hypertension     Past Surgical History  Procedure Date  . Tonsillectomy     History   Social History  . Marital Status: Widowed    Spouse Name: N/A    Number of Children: 3  . Years of Education: N/A   Occupational History  . Not on file.   Social History Main Topics  . Smoking status: Former Smoker    Types: Cigarettes  . Smokeless tobacco: Never Used  . Alcohol Use: Yes     Occasional  . Drug Use: No  . Sexually Active: No   Other Topics Concern  . Not on file   Social History Narrative  . No narrative on file    Family History  Problem Relation Age of Onset  . Heart disease Father     angina  . Heart disease Brother     Atrial fibrillation    ROS:  Positive subjective fever but no chills; cough productive of whitish sputum, no hemoptysis, dysphasia, odynophagia, melena, hematochezia, dysuria, hematuria, rash, seizure activity, orthopnea, PND, pedal edema, claudication. Remaining systems are negative.  Physical Exam:   Blood pressure 109/85, pulse 95, temperature 98.2 F (36.8 C), temperature source Oral, resp. rate 18, height 5\' 5"  (1.651 m), weight 120 lb 13 oz (54.8 kg), SpO2 93.00%.  General:  Well developed/well nourished in NAD Skin warm/dry Patient not depressed No peripheral clubbing Back-normal HEENT-normal/normal eyelids Neck supple/normal carotid upstroke bilaterally; no bruits; no JVD; no thyromegaly chest - diminished breath sounds at bases bilaterally. CV - RRR/normal S1 and S2; no murmurs, rubs or gallops;  PMI nondisplaced Abdomen -NT/ND, no HSM, no mass, + bowel sounds, no bruit 2+ femoral pulses, no bruits Ext-no edema, chords, 2+ DP Neuro-grossly  nonfocal  ECG sinus tachycardia, cannot rule out prior inferior lateral infarct.  Results for orders placed during the hospital encounter of 01/13/11 (from the past 48 hour(s))  BASIC METABOLIC PANEL     Status: Abnormal   Collection Time   01/19/11  4:52 AM      Component Value Range Comment   Sodium 134 (*) 135 - 145 (mEq/L)    Potassium 4.2  3.5 - 5.1 (mEq/L)    Chloride 99  96 - 112 (mEq/L)    CO2 26  19 - 32 (mEq/L)    Glucose, Bld 91  70 - 99 (mg/dL)    BUN 10  6 - 23 (mg/dL)    Creatinine, Ser 1.61 (*) 0.50 - 1.10 (mg/dL)    Calcium 8.5  8.4 - 10.5 (mg/dL)    GFR calc non Af Amer >90  >90 (mL/min)    GFR calc Af Amer >90  >90 (mL/min)   PHOSPHORUS     Status: Normal   Collection Time   01/19/11  4:52 AM      Component Value Range Comment   Phosphorus 3.5  2.3 - 4.6 (mg/dL)   MAGNESIUM     Status: Normal   Collection Time   01/19/11  4:52 AM      Component Value Range Comment   Magnesium 2.3  1.5 - 2.5 (mg/dL)   PRO B NATRIURETIC PEPTIDE     Status: Abnormal   Collection Time   01/19/11  4:52 AM      Component Value Range Comment   Pro B Natriuretic peptide (BNP) 1125.0 (*) 0 - 125 (pg/mL)   BASIC METABOLIC PANEL     Status: Abnormal   Collection Time   01/20/11 12:30 AM      Component Value Range Comment   Sodium 132 (*) 135 - 145 (mEq/L)    Potassium 4.5  3.5 - 5.1 (mEq/L)    Chloride 98  96 - 112 (mEq/L)    CO2 27  19 - 32 (mEq/L)    Glucose, Bld 92  70 - 99 (mg/dL)    BUN 13  6 - 23 (mg/dL)    Creatinine, Ser 0.96 (*) 0.50 - 1.10 (mg/dL)    Calcium 8.5  8.4 - 10.5 (mg/dL)    GFR calc non Af Amer >90  >90 (mL/min)    GFR calc Af Amer >90  >90 (mL/min)   PRO B NATRIURETIC PEPTIDE     Status: Abnormal   Collection Time   01/20/11  5:15 AM      Component Value Range Comment   Pro B Natriuretic peptide (BNP) 642.0 (*) 0 - 125 (pg/mL)   MAGNESIUM     Status: Normal   Collection Time   01/20/11  5:15 AM      Component Value Range Comment   Magnesium 2.5   1.5 - 2.5 (mg/dL)     No results found.  Assessment/Plan Patient Active Hospital Problem List: Acute respiratory failure (01/13/2011)  This appears to be related to pneumonia. She is not volume overloaded on examination and her LV function is normal on echocardiogram. Her BNP is only mildly elevated. I do not think she needs diuresis. Would continue antibiotics as she appears to be slowly improving.  Acute diastolic CHF - as above, feel predominant issue is pneumonia. Abnormal ECG  There is hypokinesis of the basal inferolateral wall on her echocardiogram. Will plan functional study after she recovers from pneumonia as an outpatient.  Community acquired bacterial pneumonia (01/13/2011) Antibiotics per pulmonary. Hypertension  Continue present blood pressure medications.  Olga Millers MD 01/20/2011, 2:45 PM

## 2011-01-21 ENCOUNTER — Inpatient Hospital Stay (HOSPITAL_COMMUNITY): Payer: Medicare Other

## 2011-01-21 LAB — BASIC METABOLIC PANEL
CO2: 28 mEq/L (ref 19–32)
Chloride: 101 mEq/L (ref 96–112)
GFR calc Af Amer: >90 mL/min (ref >90)
Potassium: 4.6 mEq/L (ref 3.5–5.1)
Sodium: 136 mEq/L (ref 135–145)

## 2011-01-21 LAB — MAGNESIUM: Magnesium: 2.3 mg/dL (ref 1.5–2.5)

## 2011-01-21 LAB — PHOSPHORUS: Phosphorus: 3.1 mg/dL (ref 2.3–4.6)

## 2011-01-21 NOTE — Progress Notes (Signed)
PCCM PROGRESS NOTE  Date of admission: 12/14  Primary Provider: Cleta Alberts  Pt Profile: 50 yowf, htn, patient of Dr Ellis Parents, ex smokerx 20 y (quit 10y) with only medical problem of hypertension for which she took lopressor, but otherwise healthy and fully functional living alone though sginificant family hx of CAD. Admitted 12/12with acute resp failure due to BLL CAP following visit to funeral. Did not have flu shot this season   LINES/TUBES: NIV (never intubated)  MICRO:  MRSA PCR 12/14 - neg PCT 12/14 >>12  Blood X 2 (drawn after zosyn given) 12/14 >>ng Strep Ag 12/14 >>neg Legionella Ag 12/14 >>neg Flu panel >>neg .............. PCT 12/15 - 15 ...............Marland Kitchen 18 panel REsp virus 12/18 >> ............... PCT 12/19 >>  1.9 Lactate 12/19 BNP 12/19 >> 1120 Trop < 0.3 ECHO 12./19 - ef 55% with inferior wall hypokinesis  ABX:  Ceftriaxone 12/14 >> 12/19 Azithro 12/14 >>12/19 AVelox 12/19 >> (12/24) Anti-infectives     Start     Dose/Rate Route Frequency Ordered Stop   01/18/11 2100   moxifloxacin (AVELOX) tablet 400 mg        400 mg Oral Daily 01/18/11 1854     01/13/11 1730   azithromycin (ZITHROMAX) 500 mg in dextrose 5 % 250 mL IVPB  Status:  Discontinued        500 mg 250 mL/hr over 60 Minutes Intravenous Every 24 hours 01/13/11 1631 01/18/11 1854   01/13/11 1700   cefTRIAXone (ROCEPHIN) 1 g in dextrose 5 % 50 mL IVPB  Status:  Discontinued        1 g 100 mL/hr over 30 Minutes Intravenous Every 24 hours 01/13/11 1631 01/18/11 1854   01/13/11 1445  piperacillin-tazobactam (ZOSYN) IVPB 3.375 g       3.375 g 12.5 mL/hr over 240 Minutes Intravenous To Emergency Dept 01/13/11 1442 01/13/11 1935           PROPHYLAXIS:  DVT: Lovenox  SUP: not indicated  subjective Continues to feel better. Appetite improved. EAting. In pm: pulse ox 88% on RA. On 2L [place] walked > 528feet and pulse ox 91 -> 88%. HR 120s. Diuresis continues. BNP now in 600s  BP 105/63  Pulse 97   Temp(Src) 98 F (36.7 C) (Oral)  Resp 20  Ht 5\' 5"  (1.651 m)  Wt 120 lb 13 oz (54.8 kg)  BMI 20.10 kg/m2  SpO2 93%   EXAM:  Gen:No distress.  Alert and appropriate HEENT: Dry oral mucosa. Otherwise WNL Neck: - JVD, -TM, supple Lungs: Full BS, bibasilar rales that are improved , NO Pleural friction rub on L Cardiovascular: Tachy, regular, no murmurs Abdomen: soft, NT, NABS Musculoskeletal: No C/C/E Neuro: fully intact Skin: No lesoins  DATA: CXR: 12/16  Mild hyperinflation, bibasilar infiltrates c/w PNA, decreased aeration when c/w initial film   Lab 01/21/11 0545 01/20/11 0030 01/19/11 0452  NA 136 132* 134*  K 4.6 4.5 4.2  CL 101 98 99  CO2 28 27 26   BUN 15 13 10   CREATININE 0.46* 0.47* 0.40*  GLUCOSE 98 92 91    Lab 01/18/11 0520 01/17/11 0520 01/16/11 0320  HGB 12.3 14.2 12.5  HCT 36.0 41.4 37.1  WBC 12.8* 16.6* 13.7*  PLT 285 257 216     ECHO 12/19 - There is hypokinesis of the base/mid inferolateral wall. The cavity size was normal. Wall thickness was normal. The estimated ejection fraction was 55%.     IMPRESSION:     Acute respiratory failure due  to Community acquired bacterial pneumonia. Clinically feeling better adn afebrile x 48h, sepsis biomarkers better but Oxygen requirements remain ; suspect due to volume overload and diastolic dysfunction  Plan: -PT/OT rx -po avelox through 12/24 -mobilize to continue - IS  Sinus tachycardia,   Better. HR at rest 95 but with minimal exertion is 125.   Plan: metoprolol bb at 50 bid (home dose 100 total) to continue - no further recs by cards but needs caards fu   Hypertension - monitor.     Ex-smoker -will need spirometry in future once pneumonia resolve, may have underlying COPD   Hyponatremia: this improved after dc of IV fluids and start diuresis on 12/19 > resolved 12/22  Lab 01/21/11 0545 01/20/11 0030 01/19/11 0452  NA 136 132* 134*     CHF - ? DIASTOLIC v IHD - imprpved bnp 161W -  diurese but change to po lasix - cards consult 12/21 appreciated: they want to do stress test   Dispo/best practice Plan: -Advance diet -DVT prophylaxis and SUP -Full Code - DC TELE - appreciate  PT/OT    Sandrea Hughs, MD Pulmonary and Critical Care Medicine [facility] Cell [phone]

## 2011-01-21 NOTE — Progress Notes (Signed)
Subjective:  No active cardiac complaints.  Dyspnea is improving. Not really felt to have CHF  Objective:  Vital Signs in the last 24 hours: BP 105/63  Pulse 97  Temp(Src) 98 F (36.7 C) (Oral)  Resp 20  Ht 5\' 5"  (1.651 m)  Wt 54.8 kg (120 lb 13 oz)  BMI 20.10 kg/m2  SpO2 93%  Physical Exam:  Lungs:  Reduced breath sounds at base Cardiac:  Regular rhythm, normal S1 and S2, no S3 Abdomen:  Soft, nontender, no masses Extremities:  No edema present  Intake/Output from previous day: 12/21 0701 - 12/22 0700 In: 600 [P.O.:600] Out: 1700 [Urine:1700]  Lab Results: Basic Metabolic Panel:  Basename 01/21/11 0545 01/20/11 0030  NA 136 132*  K 4.6 4.5  CL 101 98  CO2 28 27  GLUCOSE 98 92  BUN 15 13  CREATININE 0.46* 0.47*    Assessment/Plan:  1.  Predominantly pneumonia with response to treatment.  Don't think this is CHF  Needs outpt  functional study when recovered from pneumonia.  Call if needed.     Darden Palmer.  MD [facility] 01/21/2011, 9:29 AM

## 2011-01-22 LAB — PRO B NATRIURETIC PEPTIDE: Pro B Natriuretic peptide (BNP): 495.1 pg/mL — ABNORMAL HIGH (ref 0–125)

## 2011-01-22 LAB — BASIC METABOLIC PANEL
Chloride: 104 mEq/L (ref 96–112)
GFR calc Af Amer: >90 mL/min (ref >90)
GFR calc non Af Amer: >90 mL/min (ref >90)
Potassium: 4 mEq/L (ref 3.5–5.1)
Sodium: 138 mEq/L (ref 135–145)

## 2011-01-22 NOTE — Progress Notes (Signed)
  PCCM PROGRESS NOTE  Date of admission: 12/14  Primary Provider: Cleta Alberts  Pt Profile: 2 yowf, htn, patient of Dr Ellis Parents, ex smokerx 20 y (quit 10y) with only medical problem of hypertension for which she took lopressor, but otherwise healthy and fully functional living alone though sginificant family hx of CAD. Admitted 12/12with acute resp failure due to BLL CAP following visit to funeral. Did not have flu shot this season   LINES/TUBES: NIV (never intubated)  MICRO:  MRSA PCR 12/14 - neg PCT 12/14 >>12  Blood X 2 (drawn after zosyn given) 12/14 >>ng Strep Ag 12/14 >>neg Legionella Ag 12/14 >>neg Flu panel >>neg .............. PCT 12/15 - 15 ...............Marland Kitchen 18 panel REsp virus 12/18 >> ............... PCT 12/19 >>  1.9 Lactate 12/19 BNP 12/19 >> 1120 Trop < 0.3  Studies/events ECHO 12./19 - ef 55% with inferior wall hypokinesis  ABX:  Ceftriaxone 12/14 >> 12/19 Azithro 12/14 >>12/19 AVelox 12/19 >> (12/24)  PROPHYLAXIS:  DVT: Lovenox  SUP: not indicated  subjective Comfortable on 2lpm with no desats or increased wob, still somewhat congested cough  BP 98/57  Pulse 80  Temp(Src) 98.8 F (37.1 C) (Oral)  Resp 20  Ht 5\' 5"  (1.651 m)  Wt 120 lb 13 oz (54.8 kg)  BMI 20.10 kg/m2  SpO2 98%   EXAM:  Gen:No distress.  Alert and appropriate HEENT: Dry oral mucosa. Otherwise WNL Neck: - JVD, -TM, supple Lungs: Full BS, bibasilar rales that are improved , NO Pleural friction rub on L Cardiovascular: Tachy, regular, no murmurs Abdomen: soft, NT, NABS Musculoskeletal: No C/C/E Neuro: fully intact Skin: No lesoins  DATA:    Lab 01/22/11 0612 01/21/11 0545 01/20/11 0030  NA 138 136 132*  K 4.0 4.6 4.5  CL 104 101 98  CO2 26 28 27   BUN 19 15 13   CREATININE 0.45* 0.46* 0.47*  GLUCOSE 98 98 92    Lab 01/18/11 0520 01/17/11 0520 01/16/11 0320  HGB 12.3 14.2 12.5  HCT 36.0 41.4 37.1  WBC 12.8* 16.6* 13.7*  PLT 285 257 216         IMPRESSION:      Acute respiratory failure due to Community acquired bacterial pneumonia. Plan: -PT/OT rx -po avelox through 12/24 -mobilize to continue - IS  Sinus tachycardia,   Better. HR at rest 95 but with minimal exertion is 125.  Plan: metoprolol bb at 50 bid (home dose 100 total) to continue - no further recs by cards but needs cards fu   Hypertension - monitor.     Ex-smoker -will need spirometry in future once pneumonia resolve, may have underlying COPD   Hyponatremia: this improved after dc of IV fluids and start diuresis on 12/19 > resolved 12/22  Lab 01/22/11 0612 01/21/11 0545 01/20/11 0030  NA 138 136 132*     CHF - ? DIASTOLIC v IHD -see Dr York Spaniel note 12/22 > not chf   Dispo/best practice Plan: -Advance diet -DVT prophylaxis  -Full Code -discharge planning for 12/24 ???    Sandrea Hughs, MD Pulmonary and Critical Care Medicine [facility] Healthcare Cell [phone]

## 2011-01-23 ENCOUNTER — Inpatient Hospital Stay (HOSPITAL_COMMUNITY): Payer: Medicare Other

## 2011-01-23 LAB — CBC
Hemoglobin: 12.6 g/dL (ref 12.0–15.0)
MCH: 30.1 pg (ref 26.0–34.0)
MCHC: 32.7 g/dL (ref 30.0–36.0)
Platelets: 411 10*3/uL — ABNORMAL HIGH (ref 150–400)
RBC: 4.18 MIL/uL (ref 3.87–5.11)

## 2011-01-23 LAB — BASIC METABOLIC PANEL
CO2: 29 mEq/L (ref 19–32)
Calcium: 8.9 mg/dL (ref 8.4–10.5)
GFR calc non Af Amer: >90 mL/min (ref >90)
Glucose, Bld: 94 mg/dL (ref 70–99)
Potassium: 3.8 mEq/L (ref 3.5–5.1)
Sodium: 137 mEq/L (ref 135–145)

## 2011-01-23 MED ORDER — MOXIFLOXACIN HCL 400 MG PO TABS
400.0000 mg | ORAL_TABLET | Freq: Every day | ORAL | Status: DC
  Administered 2011-01-23: 400 mg via ORAL
  Filled 2011-01-23: qty 1

## 2011-01-23 NOTE — Discharge Summary (Signed)
Physician Discharge Summary  Patient ID: Lisa Mcknight MRN: [id number] DOB/AGE: 66-26-1947 66 y.o.  Admit date: 01/13/2011 Discharge date: 01/23/2011    Discharge Diagnoses:  Principal Problem:  *Acute respiratory failure Active Problems:  Hypertension  Community acquired bacterial pneumonia  Ex-smoker  Hyponatremia  Acute diastolic congestive heart failure    Brief Summary: Lisa Mcknight is a 65 y.o. y/o female with a PMH of HTN who presented initially to an Urgent Care with 5 day hx of malaise, subjective fever, sore throat and 2 day hx of increasing dyspnea. She was transferred from the Urgent Care Center to [facility] ED by ambulance for further eval of severe dyspnea. Upon arrival, she was found to be in extremis and received oxygen by NRB and nebulized albuterol with substantial improvement. She remained tachypneic and marginally hypoxic on the NRB mask. Chest xray evaluation demonstrated bibasilar infiltrates consistent with PNA.  Therefore, she was admitted to [facility] ICU for close observation.  She was treated with ceftriaxone and azithromycin for community acquired PNA.  Culture & flu negative.  She was maintained on high flow O2 initially and weaned to off as patient tolerated.  Prior to discharge, she was evaluated for O2 needs and saturations remained 90% with exertion.  Cardiology was requested in consultation for mild elevation of troponin, tachycardia, HTN and to evaluate for CHF.  It was felt dyspnea was related to pneumonia and not CHF.  2D echo was evaluated and demonstrated and EF of 55% with inferior wall hypokinesis.      Hospital Course by Discharge Summary  Acute respiratory failure / Community acquired bacterial pneumonia / Ex-smoker Lisa Mcknight is a 65 y.o. y/o female with a PMH of HTN who presented initially to an Urgent Care with 5 day hx of malaise, subjective fever, sore throat and 2 day hx of increasing dyspnea. She was transferred from the Urgent Care Center to [facility]  ED by ambulance for further eval of severe dyspnea. Upon arrival, she was found to be in extremis and received oxygen by NRB and nebulized albuterol with substantial improvement. She remained tachypneic and marginally hypoxic on the NRB mask. Chest xray evaluation demonstrated bibasilar infiltrates consistent with PNA.  Therefore, she was admitted to [facility] ICU for close observation.  She was treated with ceftriaxone, azithromycin and avelox for community acquired PNA.  Culture & flu negative.  She was maintained on high flow O2 initially and weaned to off as patient tolerated.  Prior to discharge, she was evaluated for O2 needs and saturations remained 90% with exertion.    Hypertension / Tachycardia Chronic hypertension on home metoprolol 100 mg QD. Lisa Mcknight had noted mild troponin leak during admit , initially suspicious for CHF.  However, it was felt dyspnea was related to PNA.    Hyponatremia / Acute diastolic congestive heart failure See above. Hyponatremia corrected during hospitalization.   LINES/TUBES:  NIV (never intubated)   MICRO:  MRSA PCR 12/14 - neg  PCT 12/14 >>12  Blood X 2 (drawn after zosyn given) 12/14 >>ng  Strep Ag 12/14 >>neg  Legionella Ag 12/14 >>neg  Flu panel >>neg  ..............  PCT 12/15 - 15  ...............Marland Kitchen  18 panel REsp virus 12/18 >>  ...............  PCT 12/19 >> 1.9  Lactate 12/19  BNP 12/19 >> 1120  Trop < 0.3   Studies/events  ECHO 12./19 - ef 55% with inferior wall hypokinesis   ABX:  Ceftriaxone 12/14 >> 12/19  Azithro 12/14 >>12/19  AVelox 12/19 >> (12/24)  Discharge Exam: General:well developed adult female in NAD Neuro: AAOx4, speech clear, MAE CV: s1s2 rrr, no m/r/g PULM: resp's even/non-labored on RA, lungs bilaterally with faint crackles lower posterior [id number], soft, bsx4 active Extremities: warm/dry, no edema    Discharge Labs  BMET    Component Value Date/Time   NA 137 01/23/2011 0505   K 3.8 01/23/2011 0505     CL 101 01/23/2011 0505   CO2 29 01/23/2011 0505   GLUCOSE 94 01/23/2011 0505   BUN 17 01/23/2011 0505   CREATININE 0.47* 01/23/2011 0505   CALCIUM 8.9 01/23/2011 0505   GFRNONAA >90 01/23/2011 0505   GFRAA >90 01/23/2011 0505      Lab Results  Component Value Date   WBC 7.9 01/23/2011   HGB 12.6 01/23/2011   HCT 38.5 01/23/2011   MCV 92.1 01/23/2011   PLT 411* 01/23/2011      Discharge Orders    Future Orders Please Complete By Expires   Diet - low sodium heart healthy      Increase activity slowly          Lisa Mcknight, Lisa Mcknight  Home Medication Instructions [id number]   Printed on:01/23/11 1349  Medication Information                    hydrochlorothiazide (MICROZIDE) 12.5 MG capsule Take 12.5 mg by mouth daily.             metoprolol (TOPROL-XL) 100 MG 24 hr tablet Take 100 mg by mouth daily.             Multiple Vitamins-Minerals (MULTIVITAMINS THER. W/MINERALS) TABS Take 1 tablet by mouth daily.                Follow-up Information    Make an appointment with [facility], MD. (call for appt for 1-2 weeks)    Contact information:   [address] [place] [address] [phone]       Make an appointment with Darden Palmer, MD. (make appt for 2 wk-1 month for follow up)    Contact information:   [address] [place] [address] [phone]       Follow up with DAUB,STEVE A, MD. (As needed )    Contact information:   [address] [place] [address] [phone]          Disposition: Discharge to home.   Discharged Condition: Lisa Mcknight has met maximum benefit of inpatient care and is medically stable and cleared for discharge.  Patient is pending follow up as above.   She understands she will need to call for above appointments.  Sons in room and also understand plan of care.     Time spent on disposition:  Greater than 35 minutes.   Signed: Canary Brim,  NP-C [facility] Pgr: [phone]

## 2011-01-23 NOTE — Progress Notes (Signed)
Patient blood pressure at this time 99/55, Dr. Truitt Merle agreed to hold medication Lopressor 100 mg, patient in stable condition at this time

## 2011-02-06 NOTE — Discharge Summary (Signed)
Care of patient reviewd. See NP notes for details

## 2011-05-14 ENCOUNTER — Other Ambulatory Visit: Payer: Self-pay | Admitting: Emergency Medicine

## 2011-06-13 ENCOUNTER — Other Ambulatory Visit: Payer: Self-pay | Admitting: Emergency Medicine

## 2011-07-12 ENCOUNTER — Other Ambulatory Visit: Payer: Self-pay | Admitting: Physician Assistant

## 2011-08-10 ENCOUNTER — Other Ambulatory Visit: Payer: Self-pay | Admitting: Physician Assistant

## 2011-09-11 ENCOUNTER — Other Ambulatory Visit: Payer: Self-pay | Admitting: Radiology

## 2011-09-11 MED ORDER — METOPROLOL SUCCINATE ER 100 MG PO TB24: 100.0000 mg | ORAL_TABLET | Freq: Every day | ORAL | Status: DC

## 2011-09-11 MED ORDER — HYDROCHLOROTHIAZIDE 12.5 MG PO CAPS: 12.5000 mg | ORAL_CAPSULE | Freq: Every day | ORAL | Status: DC

## 2016-07-07 ENCOUNTER — Encounter (HOSPITAL_COMMUNITY): Payer: Self-pay

## 2016-07-07 ENCOUNTER — Inpatient Hospital Stay (HOSPITAL_COMMUNITY)
Admission: EM | Admit: 2016-07-07 | Discharge: 2016-07-09 | DRG: 871 | Disposition: A | Discharge status: Home / Self Care | Payer: Medicare HMO | Attending: Internal Medicine | Admitting: Internal Medicine

## 2016-07-07 ENCOUNTER — Inpatient Hospital Stay (HOSPITAL_COMMUNITY): Payer: Medicare HMO

## 2016-07-07 ENCOUNTER — Emergency Department (HOSPITAL_COMMUNITY): Payer: Medicare HMO

## 2016-07-07 DIAGNOSIS — E079 Disorder of thyroid, unspecified: Secondary | ICD-10-CM | POA: Diagnosis not present

## 2016-07-07 DIAGNOSIS — Z9889 Other specified postprocedural states: Secondary | ICD-10-CM | POA: Diagnosis not present

## 2016-07-07 DIAGNOSIS — J159 Unspecified bacterial pneumonia: Secondary | ICD-10-CM | POA: Diagnosis present

## 2016-07-07 DIAGNOSIS — R7989 Other specified abnormal findings of blood chemistry: Secondary | ICD-10-CM | POA: Diagnosis present

## 2016-07-07 DIAGNOSIS — R0600 Dyspnea, unspecified: Secondary | ICD-10-CM | POA: Diagnosis not present

## 2016-07-07 DIAGNOSIS — Z87891 Personal history of nicotine dependence: Secondary | ICD-10-CM | POA: Diagnosis not present

## 2016-07-07 DIAGNOSIS — Z79899 Other long term (current) drug therapy: Secondary | ICD-10-CM

## 2016-07-07 DIAGNOSIS — J9811 Atelectasis: Secondary | ICD-10-CM | POA: Diagnosis present

## 2016-07-07 DIAGNOSIS — I5032 Chronic diastolic (congestive) heart failure: Secondary | ICD-10-CM | POA: Diagnosis present

## 2016-07-07 DIAGNOSIS — J189 Pneumonia, unspecified organism: Secondary | ICD-10-CM

## 2016-07-07 DIAGNOSIS — A419 Sepsis, unspecified organism: Principal | ICD-10-CM | POA: Diagnosis present

## 2016-07-07 DIAGNOSIS — J90 Pleural effusion, not elsewhere classified: Secondary | ICD-10-CM | POA: Diagnosis not present

## 2016-07-07 DIAGNOSIS — J181 Lobar pneumonia, unspecified organism: Secondary | ICD-10-CM

## 2016-07-07 DIAGNOSIS — R000 Tachycardia, unspecified: Secondary | ICD-10-CM | POA: Diagnosis present

## 2016-07-07 DIAGNOSIS — I110 Hypertensive heart disease with heart failure: Secondary | ICD-10-CM | POA: Diagnosis present

## 2016-07-07 DIAGNOSIS — R05 Cough: Secondary | ICD-10-CM | POA: Diagnosis not present

## 2016-07-07 DIAGNOSIS — I10 Essential (primary) hypertension: Secondary | ICD-10-CM | POA: Diagnosis present

## 2016-07-07 DIAGNOSIS — R0602 Shortness of breath: Secondary | ICD-10-CM | POA: Diagnosis not present

## 2016-07-07 DIAGNOSIS — R091 Pleurisy: Secondary | ICD-10-CM | POA: Diagnosis not present

## 2016-07-07 DIAGNOSIS — I159 Secondary hypertension, unspecified: Secondary | ICD-10-CM | POA: Diagnosis not present

## 2016-07-07 HISTORY — DX: Pneumonia, unspecified organism: J18.9

## 2016-07-07 LAB — COMPREHENSIVE METABOLIC PANEL
ALBUMIN: 3.8 g/dL (ref 3.5–5.0)
ALK PHOS: 161 U/L — AB (ref 38–126)
ALT: 49 U/L (ref 14–54)
AST: 43 U/L — AB (ref 15–41)
Anion gap: 10 (ref 5–15)
BILIRUBIN TOTAL: 0.4 mg/dL (ref 0.3–1.2)
BUN: 14 mg/dL (ref 6–20)
CO2: 23 mmol/L (ref 22–32)
Calcium: 10 mg/dL (ref 8.9–10.3)
Chloride: 103 mmol/L (ref 101–111)
Creatinine, Ser: 0.62 mg/dL (ref 0.44–1.00)
GFR calc Af Amer: >60 mL/min (ref >60)
GFR calc non Af Amer: >60 mL/min (ref >60)
GLUCOSE: 136 mg/dL — AB (ref 65–99)
Potassium: 3.6 mmol/L (ref 3.5–5.1)
SODIUM: 136 mmol/L (ref 135–145)
TOTAL PROTEIN: 8.2 g/dL — AB (ref 6.5–8.1)

## 2016-07-07 LAB — CBC WITH DIFFERENTIAL/PLATELET
BASOS ABS: 0.1 10*3/uL (ref 0.0–0.1)
BASOS PCT: 1 %
EOS ABS: 0.1 10*3/uL (ref 0.0–0.7)
Eosinophils Relative: 1 %
HEMATOCRIT: 45.5 % (ref 36.0–46.0)
HEMOGLOBIN: 15.6 g/dL — AB (ref 12.0–15.0)
Lymphocytes Relative: 10 %
Lymphs Abs: 1.1 10*3/uL (ref 0.7–4.0)
MCH: 30.6 pg (ref 26.0–34.0)
MCHC: 34.3 g/dL (ref 30.0–36.0)
MCV: 89.2 fL (ref 78.0–100.0)
Monocytes Absolute: 1.3 10*3/uL — ABNORMAL HIGH (ref 0.1–1.0)
Monocytes Relative: 11 %
NEUTROS ABS: 8.7 10*3/uL — AB (ref 1.7–7.7)
NEUTROS PCT: 77 %
Platelets: 457 10*3/uL — ABNORMAL HIGH (ref 150–400)
RBC: 5.1 MIL/uL (ref 3.87–5.11)
RDW: 13.2 % (ref 11.5–15.5)
WBC: 11.1 10*3/uL — AB (ref 4.0–10.5)

## 2016-07-07 LAB — LACTIC ACID, PLASMA: Lactic Acid, Venous: 1 mmol/L (ref 0.5–1.9)

## 2016-07-07 LAB — BRAIN NATRIURETIC PEPTIDE: B Natriuretic Peptide: 51.5 pg/mL (ref 0.0–100.0)

## 2016-07-07 LAB — TROPONIN I: Troponin I: <0.03 ng/mL (ref <0.03)

## 2016-07-07 MED ORDER — AMLODIPINE BESYLATE 5 MG PO TABS
5.0000 mg | ORAL_TABLET | Freq: Every day | ORAL | Status: DC
  Administered 2016-07-08 – 2016-07-09 (×3): 5 mg via ORAL
  Filled 2016-07-07 (×3): qty 1

## 2016-07-07 MED ORDER — ONDANSETRON HCL 4 MG/2ML IJ SOLN: 4.0000 mg | Freq: Four times a day (QID) | INTRAMUSCULAR | Status: DC | PRN

## 2016-07-07 MED ORDER — IOPAMIDOL (ISOVUE-370) INJECTION 76%
INTRAVENOUS | Status: AC
  Filled 2016-07-07: qty 100

## 2016-07-07 MED ORDER — DEXTROSE 5 % IV SOLN
1.0000 g | Freq: Once | INTRAVENOUS | Status: AC
  Administered 2016-07-07: 1 g via INTRAVENOUS
  Filled 2016-07-07: qty 10

## 2016-07-07 MED ORDER — POTASSIUM CHLORIDE IN NACL 20-0.9 MEQ/L-% IV SOLN
INTRAVENOUS | Status: AC
  Administered 2016-07-08: 02:00:00 via INTRAVENOUS
  Filled 2016-07-07 (×2): qty 1000

## 2016-07-07 MED ORDER — LEVOFLOXACIN 750 MG PO TABS
750.0000 mg | ORAL_TABLET | Freq: Once | ORAL | Status: AC
  Administered 2016-07-07: 750 mg via ORAL
  Filled 2016-07-07: qty 1

## 2016-07-07 MED ORDER — ACETAMINOPHEN 325 MG PO TABS: 650.0000 mg | ORAL_TABLET | Freq: Four times a day (QID) | ORAL | Status: DC | PRN

## 2016-07-07 MED ORDER — HYDRALAZINE HCL 20 MG/ML IJ SOLN
5.0000 mg | INTRAMUSCULAR | Status: DC | PRN
  Filled 2016-07-07: qty 0.25

## 2016-07-07 MED ORDER — SODIUM CHLORIDE 0.9 % IV SOLN: INTRAVENOUS | Status: DC

## 2016-07-07 MED ORDER — SODIUM CHLORIDE 0.9 % IV BOLUS (SEPSIS)
1000.0000 mL | Freq: Once | INTRAVENOUS | Status: AC
  Administered 2016-07-07: 1000 mL via INTRAVENOUS

## 2016-07-07 MED ORDER — IOPAMIDOL (ISOVUE-300) INJECTION 61%
INTRAVENOUS | Status: AC
  Filled 2016-07-07: qty 100

## 2016-07-07 MED ORDER — LEVOFLOXACIN 750 MG PO TABS: 750.0000 mg | ORAL_TABLET | Freq: Every day | ORAL | 0 refills | Status: DC

## 2016-07-07 MED ORDER — IOPAMIDOL (ISOVUE-370) INJECTION 76%
100.0000 mL | Freq: Once | INTRAVENOUS | Status: AC | PRN
  Administered 2016-07-07: 100 mL via INTRAVENOUS

## 2016-07-07 MED ORDER — DEXTROSE 5 % IV SOLN
1.0000 g | INTRAVENOUS | Status: DC
  Administered 2016-07-08 – 2016-07-09 (×2): 1 g via INTRAVENOUS
  Filled 2016-07-07 (×2): qty 10

## 2016-07-07 MED ORDER — ALBUTEROL SULFATE (2.5 MG/3ML) 0.083% IN NEBU
5.0000 mg | INHALATION_SOLUTION | Freq: Once | RESPIRATORY_TRACT | Status: AC
  Administered 2016-07-07: 5 mg via RESPIRATORY_TRACT
  Filled 2016-07-07: qty 6

## 2016-07-07 MED ORDER — DEXTROSE 5 % IV SOLN
500.0000 mg | INTRAVENOUS | Status: DC
Start: 2016-07-08 — End: 2016-07-09
  Administered 2016-07-08: 500 mg via INTRAVENOUS
  Filled 2016-07-07 (×2): qty 500

## 2016-07-07 MED ORDER — ENOXAPARIN SODIUM 40 MG/0.4ML ~~LOC~~ SOLN
40.0000 mg | Freq: Every day | SUBCUTANEOUS | Status: DC
  Administered 2016-07-08 (×2): 40 mg via SUBCUTANEOUS
  Filled 2016-07-07 (×2): qty 0.4

## 2016-07-07 MED ORDER — GUAIFENESIN-DM 100-10 MG/5ML PO SYRP
5.0000 mL | ORAL_SOLUTION | Freq: Four times a day (QID) | ORAL | Status: DC
  Administered 2016-07-08 – 2016-07-09 (×7): 5 mL via ORAL
  Filled 2016-07-07 (×7): qty 10

## 2016-07-07 MED ORDER — ACETAMINOPHEN 650 MG RE SUPP: 650.0000 mg | Freq: Four times a day (QID) | RECTAL | Status: DC | PRN

## 2016-07-07 MED ORDER — ONDANSETRON HCL 4 MG PO TABS: 4.0000 mg | ORAL_TABLET | Freq: Four times a day (QID) | ORAL | Status: DC | PRN

## 2016-07-07 NOTE — Discharge Instructions (Signed)
You have been seen today for symptoms consistent with a possible pneumonia. Please take all of your antibiotics until finished!   You may develop abdominal discomfort or diarrhea from the antibiotic.  You may help offset this with probiotics which you can buy or get in yogurt. Do not eat or take the probiotics until 2 hours after your antibiotic.   Be sure to stay well-hydrated. Get plenty of rest. Follow-up with your primary care provider for any further management of this issue. You may need a repeat chest x-ray in 2-3 weeks to assure the pneumonia has resolved. Return to the ED should symptoms worsen.

## 2016-07-07 NOTE — ED Triage Notes (Signed)
Patient c/o having a cold x 3 weeks and SOB.. Patient states she has been having a productive cough with clear sputum. Patient denies any fever.

## 2016-07-07 NOTE — ED Notes (Addendum)
Pt desat to 90% on RA and became tachycardic @ 138 upon ambulation. Shawn, PA made aware

## 2016-07-07 NOTE — H&P (Signed)
History and Physical    Analena Gama KYH:[id number] DOB: 1945-06-22 DOA: 07/07/2016  PCP: Patient, No Pcp Per   Patient coming from: Home   Chief Complaint: Shortness of breath.  HPI: Shakera Ebrahimi is a 71 y.o. female with medical history significant for hypertension, diastolic CHF. Patient presented today with complaints of shortness of breath that started 2-3 days ago- present at rest but more significant with exertion. Patient had a cold- with congestion, cough productive of mucoid sputum- about 2 weeks ago which she says has resolved.  She denies fever or chills. No chest pain, no leg swelling redness or warmth. No recent travels no personal or family history of blood clots, no weight loss.  Patient quit smoking about 5 years ago, half a pack per day for ~50 years. Patient has not seen a primary care provider in this for years.  ED Course: Patient's tachycardic- rates- 102- 137, tachypneic, with elevated blood pressure. Blood work showed- WBC- 11.1, troponin  X1 < 0.03, BNP- 51.5. Cr- 0.62- slightly elevated above baseline 0.4. Chest x-ray showed a right moderate effusion and left small effusion. Patient initially required O2, and With ambulation patient became SOB, with O2 sat dropping to 90%. Patient was given ceftriaxone and Levaquin for pneumonia.  Review of Systems:  CONSTITUTIONAL- No Fever, weightloss, night sweat or change in appetite. SKIN- No Rash, colour changes or itching. HEAD- No Headache or dizziness. Mouth/throat- No Sorethroat, dentures, or bleeding gums. CARDIAC- No Palpitations, or chest pain. GI- No nausea, vomiting, diarrhoea, constipation, abd pain. URINARY- No Frequency, urgency, straining or dysuria. NEUROLOGIC- No Numbness, syncope, seizures or burning. [facility]- Denies depression or anxiety.  Past Medical History:  Diagnosis Date  . Hypertension   . Pneumonia     Past Surgical History:  Procedure Laterality Date  . TONSILLECTOMY    No Known  Allergies  Family History  Problem Relation Age of Onset  . Heart disease Father        angina  . Heart disease Brother        Atrial fibrillation    Prior to Admission medications   Medication Sig Start Date End Date Taking? Authorizing Provider  Multiple Vitamins-Minerals (MULTIVITAMINS THER. W/MINERALS) TABS Take 1 tablet by mouth daily.     Yes [provider]  levofloxacin (LEVAQUIN) 750 MG tablet Take 1 tablet (750 mg total) by mouth daily. 07/08/16 07/12/16  Lorayne Bender, PA-C    Physical Exam: Vitals:   07/07/16 1821 07/07/16 1902 07/07/16 1939 07/07/16 2012  BP: (!) 173/92 (!) 177/91 (!) 177/91 (!) 167/94  Pulse: (!) 109 (!) 105 (!) 112 (!) 105  Resp: (!) 35 (!) 26 (!) 27 (!) 32  Temp:      TempSrc:      SpO2: 95% 95% 95% 96%  Weight:      Height:        Constitutional: NAD, calm, comfortable Vitals:   07/07/16 1821 07/07/16 1902 07/07/16 1939 07/07/16 2012  BP: (!) 173/92 (!) 177/91 (!) 177/91 (!) 167/94  Pulse: (!) 109 (!) 105 (!) 112 (!) 105  Resp: (!) 35 (!) 26 (!) 27 (!) 32  Temp:      TempSrc:      SpO2: 95% 95% 95% 96%  Weight:      Height:       Eyes: PERRL, lids and conjunctivae normal ENMT: Mucous membranes are moist. Posterior pharynx clear of any exudate or lesions.Normal dentition.  Neck: normal, supple Respiratory: Crackles bilateral bases, Normal respiratory  effort. No accessory muscle use.  Cardiovascular: Regular rate and rhythm, no murmurs / rubs / gallops. No extremity edema. 2+ pedal pulses. No carotid bruits.  Abdomen: no tenderness, no masses palpated. No hepatosplenomegaly. Bowel sounds positive.  Musculoskeletal: no clubbing / cyanosis. No joint deformity upper and lower extremities. Good ROM, no contractures. Normal muscle tone.  Skin: no rashes, lesions, ulcers. No induration Neurologic: CN 2-12 grossly intact. Sensation intact, DTR normal. Strength 5/5 in all 4.  Psychiatric: Normal judgment and insight. Alert and oriented x  3. Normal mood.   Labs on Admission: I have personally reviewed following labs and imaging studies  CBC:  Recent Labs Lab 07/07/16 1250  WBC 11.1*  NEUTROABS 8.7*  HGB 15.6*  HCT 45.5  MCV 89.2  PLT 062*   Basic Metabolic Panel:  Recent Labs Lab 07/07/16 1250  NA 136  K 3.6  CL 103  CO2 23  GLUCOSE 136*  BUN 14  CREATININE 0.62  CALCIUM 10.0   Liver Function Tests:  Recent Labs Lab 07/07/16 1250  AST 43*  ALT 49  ALKPHOS 161*  BILITOT 0.4  PROT 8.2*  ALBUMIN 3.8   Cardiac Enzymes:  Recent Labs Lab 07/07/16 1250  TROPONINI <0.03   Radiological Exams on Admission: Dg Chest 2 View  Result Date: 07/07/2016 CLINICAL DATA:  Shortness of breath.  Cough. EXAM: CHEST  2 VIEW COMPARISON:  01/23/2011 FINDINGS: The patient has a small left pleural effusion and a moderate right pleural effusion. Overall heart size and pulmonary vascularity are normal. There is chronic accentuation of the interstitial markings with no consolidative infiltrate or effusion. Scarring at the lung apices.  Aortic atherosclerosis. IMPRESSION: Moderate right pleural effusion, nonspecific. Small left pleural effusion, nonspecific. Chronic slight accentuation of the interstitial markings without acute infiltrate. Electronically Signed   By: Lorriane Shire M.D.   On: 07/07/2016 14:00   Ct Angio Chest Pe W Or Wo Contrast  Result Date: 07/07/2016 CLINICAL DATA:  Dyspnea starting 1 week ago. Endorses upper respiratory symptoms nasal congestion and sneezing as well as productive cough. EXAM: CT ANGIOGRAPHY CHEST WITH CONTRAST TECHNIQUE: Multidetector CT imaging of the chest was performed using the standard protocol during bolus administration of intravenous contrast. Multiplanar CT image reconstructions and MIPs were obtained to evaluate the vascular anatomy. CONTRAST:  80 cc Isovue 370 IV COMPARISON:  01/13/2011 FINDINGS: Cardiovascular: Normal branch pattern of the great vessels off the aortic arch. There  is aortic atherosclerosis without aneurysm or dissection. Coronary arteriosclerosis is noted. Visualized cardiac chambers are normal in size. No pericardial effusion or thickening. Mediastinum/Nodes: No enlarged mediastinal, hilar, or axillary lymph nodes. Asymmetric stable enlargement of the right lobe of the thyroid gland with partially cystic, partially solid appearing 2.9 x 1.8 x 2.7 cm nodule, more conspicuous than on prior exam and may be better characterized with ultrasound. Left lobe is not visualized and may be surgically absent. No the trachea mainstem bronchi are unremarkable. The esophagus is within normal limits. No mediastinal nor hilar lymphadenopathy. Lungs/Pleura: Moderate right and small left pleural effusions with compressive atelectasis. There is perihilar bronchiectasis. Respiratory motion artifact slightly limit assessment of the lungs especially in the upper lobes. Streaky airspace opacities are noted along the left lower lobe bronchi which may reflect subsegmental atelectasis. Small foci of pneumonia are not entirely excluded. Upper Abdomen: No acute abnormality. Musculoskeletal: Superior endplate Schmorl's node noted at T10 with mild compression of T11 since 2012, likely remote. No retropulsion. The bones are demineralized. Review of the  MIP images confirms the above findings. IMPRESSION: 1. Moderate right and small left pleural effusions with adjacent compressive atelectasis. 2. Bilateral bronchiectasis is noted with patchy airspace opacities in the left lower lobe consistent with atelectasis. Small foci of pneumonia are not entirely excluded. 3. **An incidental finding of potential clinical significance has been found. 2.9 x 1.8 x 2.7 cm partially solid and partially cystic right thyroid mass, more conspicuous on current exam. This may benefit from further correlation with nonemergent ultrasound** 4. Aortic atherosclerosis and minimal coronary arteriosclerosis. 5. No acute pulmonary  embolus. Electronically Signed   By: Ashley Royalty M.D.   On: 07/07/2016 21:05   EKG: Independently reviewed.   Assessment/Plan Principal Problem:   SOB (shortness of breath) Active Problems:   Hypertension   Community acquired bacterial pneumonia   Tachycardia   Pleural effusion   Thyroid mass  Shortness of breath- most likely secondary to pleural effusions, also possible CAP contributing- which seems significant compared to the degree of pneumonia noted on CTA. WBC- 11.1. IV ceftriaxone and Levaquin given in the ED. - Admits to inpatient telemetry - Due to concern for PE with persistent tachycardia, and chest x-ray not very suggestive of CAP, obtained CTA to rule out PE, and evaluate effusion. - Continue IV antibiotics with ceftriaxone and azithromycin - Pulmonary consult a.m. for thoracocentesis, will likely help with dyspnea  CAP- small foci of pneumonia are not entirely excluded on CT chest. WBC- 11.1. Patient meeting Sepsis criteria- tachycardia, tachypnea. - IV antibiotics as above. - Check lactic acid X1. - Mucolytics  Thyroid mass- incidental finding on CTA chest. Nonemergent thyroid ultrasound recommended. ? Relationship of thyroid disease to pleural effusions - Will get TSH, T3, T4.  Tachycardia- more likely related to effusion with subsequent shortness of breath, also possibly 2/2 CAP with sepsis, or dehydration- mild elevated creatinine 0.64, baseline- 0.4. - Monitor - Hydrate, Ns +20 KCL X12 hrs, especially after contrast exposure.  Hypertension- elevated , patient's stops antihypertensives- ?names about 5 years ago- said he didn't make a difference with her blood pressure. - PRN hydralazine 5mg  >190/110, as pt BP is likely chronically elevated. - Start Norvasc 5 daily  DVT prophylaxis: Lovenox  Code Status: Full Family Communication: Son at bedside Disposition Plan: Home Consults called: None Admission status: Inpatient telemetry   Bethena Roys  MD [facility] Pager [phone]  If 2am-7AM, please contact night-coverage [email or web address] Password [facility]  07/07/2016, 8:31 PM

## 2016-07-07 NOTE — ED Provider Notes (Signed)
[facility] DEPT Provider Note   CSN: [id number] Arrival date & time: 07/07/16  1209     History   Chief Complaint Chief Complaint  Patient presents with  . Shortness of Breath  . Tachycardia    HPI Lisa Mcknight is a 71 y.o. female.  HPI   Lisa Mcknight is a 71 y.o. female, with a history of HTN, presenting to the ED with Shortness of breath beginning about a week ago. Patient endorses URI symptoms including nasal congestion, sneezing, and productive cough with clear sputum beginning 2 weeks ago. About a week ago she began to have shortness of breath with exertion that has since worsened. She has taken Mucinex without complete relief. She states she has been eating and drinking normally, however, has been doing yard work this week. Denies fever/chills, N/V/D, CP, SOB at rest, abdominal pain, peripheral edema, or any other complaints.   Denies history of PE/DVT, recent surgery, trauma, or immobilization.  Patient has a history of hypertension, but states she "stopped taking the medications for it a while ago."   Past Medical History:  Diagnosis Date  . Hypertension   . Pneumonia     Patient Active Problem List   Diagnosis Date Noted  . Acute diastolic congestive heart failure ([place]) 01/20/2011  . Hypertension 01/13/2011  . Acute respiratory failure ([place]) 01/13/2011  . Community acquired bacterial pneumonia 01/13/2011  . Ex-smoker 01/13/2011  . Hyponatremia 01/13/2011    Past Surgical History:  Procedure Laterality Date  . TONSILLECTOMY      OB History    No data available       Home Medications    Prior to Admission medications   Medication Sig Start Date End Date Taking? Authorizing Provider  Multiple Vitamins-Minerals (MULTIVITAMINS THER. W/MINERALS) TABS Take 1 tablet by mouth daily.     Yes [provider]  levofloxacin (LEVAQUIN) 750 MG tablet Take 1 tablet (750 mg total) by mouth daily. 07/08/16 07/12/16  Lorayne Bender, PA-C    Family  History Family History  Problem Relation Age of Onset  . Heart disease Father        angina  . Heart disease Brother        Atrial fibrillation    Social History Social History  Substance Use Topics  . Smoking status: Former Smoker    Types: Cigarettes  . Smokeless tobacco: Never Used  . Alcohol use Yes     Comment: Occasional     Allergies   Patient has no known allergies.   Review of Systems Review of Systems  Constitutional: Negative for chills, diaphoresis and fever.  Respiratory: Positive for cough and shortness of breath.   Cardiovascular: Negative for chest pain.  Gastrointestinal: Negative for abdominal pain, diarrhea, nausea and vomiting.  Skin: Negative for rash.  Neurological: Negative for dizziness, weakness and light-headedness.  All other systems reviewed and are negative.    Physical Exam Updated Vital Signs BP (!) 172/94 (BP Location: Left Arm) Comment: pt has arms tremors  Pulse (!) 136   Temp 98.2 F (36.8 C) (Oral)   Resp 16   Ht 5\' 5"  (1.651 m)   Wt 56.7 kg (125 lb)   SpO2 94%   BMI 20.80 kg/m   Physical Exam  Constitutional: She appears well-developed and well-nourished. No distress.  HENT:  Head: Normocephalic and atraumatic.  Eyes: Conjunctivae are normal.  Neck: Neck supple.  Cardiovascular: Regular rhythm, normal heart sounds and intact distal pulses.  Tachycardia present.   Pulmonary/Chest:  Effort normal and breath sounds normal. No respiratory distress.  Patient shows no increased work of breathing at rest. Speaks in full sentences without apparent difficulty.  Abdominal: Soft. There is no tenderness. There is no guarding.  Musculoskeletal: She exhibits no edema.  Lymphadenopathy:    She has no cervical adenopathy.  Neurological: She is alert.  Skin: Skin is warm and dry. She is not diaphoretic.  Psychiatric: She has a normal mood and affect. Her behavior is normal.  Nursing note and vitals reviewed.    ED Treatments /  Results  Labs (all labs ordered are listed, but only abnormal results are displayed) Labs Reviewed  CBC WITH DIFFERENTIAL/PLATELET - Abnormal; Notable for the following:       Result Value   WBC 11.1 (*)    Hemoglobin 15.6 (*)    Platelets 457 (*)    Neutro Abs 8.7 (*)    Monocytes Absolute 1.3 (*)    All other components within normal limits  COMPREHENSIVE METABOLIC PANEL - Abnormal; Notable for the following:    Glucose, Bld 136 (*)    Total Protein 8.2 (*)    AST 43 (*)    Alkaline Phosphatase 161 (*)    All other components within normal limits  BRAIN NATRIURETIC PEPTIDE  TROPONIN I    EKG  EKG Interpretation  Date/Time:  Friday July 07 2016 12:22:46 EDT Ventricular Rate:  127 PR Interval:    QRS Duration: 100 QT Interval:  330 QTC Calculation: 480 R Axis:   58 Text Interpretation:  Sinus tachycardia Consider right atrial enlargement Probable inferior infarct, old Probable anterolateral infarct, old similar to previous Confirmed by Theotis Burrow [phone]) on 07/07/2016 12:31:44 PM Also confirmed by Theotis Burrow [phone]), editor Verna Czech [phone])  on 07/07/2016 2:12:30 PM       Radiology Dg Chest 2 View  Result Date: 07/07/2016 CLINICAL DATA:  Shortness of breath.  Cough. EXAM: CHEST  2 VIEW COMPARISON:  01/23/2011 FINDINGS: The patient has a small left pleural effusion and a moderate right pleural effusion. Overall heart size and pulmonary vascularity are normal. There is chronic accentuation of the interstitial markings with no consolidative infiltrate or effusion. Scarring at the lung apices.  Aortic atherosclerosis. IMPRESSION: Moderate right pleural effusion, nonspecific. Small left pleural effusion, nonspecific. Chronic slight accentuation of the interstitial markings without acute infiltrate. Electronically Signed   By: Lorriane Shire M.D.   On: 07/07/2016 14:00    Procedures Procedures (including critical care time)  Medications Ordered in ED Medications    albuterol (PROVENTIL) (2.5 MG/3ML) 0.083% nebulizer solution 5 mg (5 mg Nebulization Given 07/07/16 1250)  sodium chloride 0.9 % bolus 1,000 mL (0 mLs Intravenous Stopped 07/07/16 1454)  cefTRIAXone (ROCEPHIN) 1 g in dextrose 5 % 50 mL IVPB (0 g Intravenous Stopped 07/07/16 1557)  levofloxacin (LEVAQUIN) tablet 750 mg (750 mg Oral Given 07/07/16 1557)  sodium chloride 0.9 % bolus 1,000 mL (0 mLs Intravenous Stopped 07/07/16 1712)     Initial Impression / Assessment and Plan / ED Course  I have reviewed the triage vital signs and the nursing notes.  Pertinent labs & imaging results that were available during my care of the patient were reviewed by me and considered in my medical decision making (see chart for details).  Clinical Course as of Jul 07 1921  Fri Jul 07, 2016  1536 Improvement in patient's breathing. Patient still tachycardic and tachypneic.   [SJ]  [place] with Dr. Denton Brick, hospitalist, who agrees to  admit the patient.   [SJ]    Clinical Course User Index [SJ] Shyanne Mcclary C, PA-C    Patient presents with symptoms suspicious for possible pneumonia. Pleural effusion noted on chest x-ray. Lower suspicion for PE. Improvement in patient's breathing while at rest, however, patient remained tachycardic. Patient to be admitted for persistent tachycardia and drop in SPO2 upon ambulation.   Findings and plan of care discussed with Theotis Burrow, MD. Dr. Rex Kras personally evaluated and examined this patient.  Vitals:   07/07/16 1338 07/07/16 1400 07/07/16 1415 07/07/16 1435  BP: (!) 192/92 (!) 159/85  (!) 169/82  Pulse: (!) 128 (!) 114 (!) 110 (!) 109  Resp: (!) 34 (!) 29 (!) 25 (!) 24  Temp:      TempSrc:      SpO2: 94% 94% 95% 95%  Weight:      Height:       Vitals:   07/07/16 1804 07/07/16 1808 07/07/16 1821 07/07/16 1902  BP:   (!) 173/92 (!) 177/91  Pulse: (!) 111 (!) 113 (!) 109 (!) 105  Resp: (!) 27 (!) 23 (!) 35 (!) 26  Temp:      TempSrc:      SpO2: 96% 95% 95% 95%   Weight:      Height:         Final Clinical Impressions(s) / ED Diagnoses   Final diagnoses:  Community acquired pneumonia of right lower lobe of lung (HCC)    New Prescriptions New Prescriptions   LEVOFLOXACIN (LEVAQUIN) 750 MG TABLET    Take 1 tablet (750 mg total) by mouth daily.     Lorayne Bender, PA-C 07/07/16 1923    Sharlett Iles, MD 07/11/16 2024

## 2016-07-07 NOTE — ED Notes (Signed)
Pt from home reports cold with cough x 1 month. Pt adds she has had SOB x1 week w/ hx of PNA. Pt is A&O and in NAD. Pt talks in complete sentences and ambulates in room w/o difficulty

## 2016-07-08 ENCOUNTER — Inpatient Hospital Stay (HOSPITAL_COMMUNITY): Payer: Medicare HMO

## 2016-07-08 DIAGNOSIS — E079 Disorder of thyroid, unspecified: Secondary | ICD-10-CM

## 2016-07-08 DIAGNOSIS — J90 Pleural effusion, not elsewhere classified: Secondary | ICD-10-CM

## 2016-07-08 DIAGNOSIS — Z9889 Other specified postprocedural states: Secondary | ICD-10-CM

## 2016-07-08 DIAGNOSIS — J159 Unspecified bacterial pneumonia: Secondary | ICD-10-CM

## 2016-07-08 DIAGNOSIS — J181 Lobar pneumonia, unspecified organism: Secondary | ICD-10-CM

## 2016-07-08 DIAGNOSIS — R000 Tachycardia, unspecified: Secondary | ICD-10-CM

## 2016-07-08 DIAGNOSIS — A419 Sepsis, unspecified organism: Principal | ICD-10-CM

## 2016-07-08 DIAGNOSIS — I159 Secondary hypertension, unspecified: Secondary | ICD-10-CM

## 2016-07-08 DIAGNOSIS — R0602 Shortness of breath: Secondary | ICD-10-CM

## 2016-07-08 LAB — CBC
HCT: 43.1 % (ref 36.0–46.0)
Hemoglobin: 14.5 g/dL (ref 12.0–15.0)
MCH: 30.8 pg (ref 26.0–34.0)
MCHC: 33.6 g/dL (ref 30.0–36.0)
MCV: 91.5 fL (ref 78.0–100.0)
PLATELETS: 403 10*3/uL — AB (ref 150–400)
RBC: 4.71 MIL/uL (ref 3.87–5.11)
RDW: 13.4 % (ref 11.5–15.5)
WBC: 8.9 10*3/uL (ref 4.0–10.5)

## 2016-07-08 LAB — BODY FLUID CELL COUNT WITH DIFFERENTIAL
EOS FL: 4 %
Lymphs, Fluid: 71 %
MONOCYTE-MACROPHAGE-SEROUS FLUID: 2 % — AB (ref 50–90)
NEUTROPHIL FLUID: 23 % (ref 0–25)
Total Nucleated Cell Count, Fluid: 4927 cu mm — ABNORMAL HIGH (ref 0–1000)

## 2016-07-08 LAB — BASIC METABOLIC PANEL
Anion gap: 11 (ref 5–15)
BUN: 6 mg/dL (ref 6–20)
CALCIUM: 9.1 mg/dL (ref 8.9–10.3)
CO2: 23 mmol/L (ref 22–32)
CREATININE: 0.45 mg/dL (ref 0.44–1.00)
Chloride: 106 mmol/L (ref 101–111)
GFR calc non Af Amer: >60 mL/min (ref >60)
Glucose, Bld: 92 mg/dL (ref 65–99)
Potassium: 3.8 mmol/L (ref 3.5–5.1)
SODIUM: 140 mmol/L (ref 135–145)

## 2016-07-08 LAB — GLUCOSE, PLEURAL OR PERITONEAL FLUID: Glucose, Fluid: 84 mg/dL

## 2016-07-08 LAB — TSH: TSH: 0.641 u[IU]/mL (ref 0.350–4.500)

## 2016-07-08 LAB — LACTATE DEHYDROGENASE, PLEURAL OR PERITONEAL FLUID: LD, Fluid: 126 U/L — ABNORMAL HIGH (ref 3–23)

## 2016-07-08 MED ORDER — SODIUM CHLORIDE 0.9 % IV SOLN
INTRAVENOUS | Status: DC
  Administered 2016-07-08: 18:00:00 via INTRAVENOUS

## 2016-07-08 MED ORDER — IPRATROPIUM BROMIDE 0.02 % IN SOLN
0.5000 mg | Freq: Four times a day (QID) | RESPIRATORY_TRACT | Status: DC
  Administered 2016-07-08: 0.5 mg via RESPIRATORY_TRACT
  Filled 2016-07-08: qty 2.5

## 2016-07-08 MED ORDER — IPRATROPIUM BROMIDE 0.02 % IN SOLN
0.5000 mg | Freq: Three times a day (TID) | RESPIRATORY_TRACT | Status: DC
  Administered 2016-07-08: 0.5 mg via RESPIRATORY_TRACT
  Filled 2016-07-08: qty 2.5

## 2016-07-08 MED ORDER — LEVALBUTEROL HCL 0.63 MG/3ML IN NEBU
0.6300 mg | INHALATION_SOLUTION | Freq: Four times a day (QID) | RESPIRATORY_TRACT | Status: DC
  Administered 2016-07-08: 0.63 mg via RESPIRATORY_TRACT
  Filled 2016-07-08: qty 3

## 2016-07-08 MED ORDER — LEVALBUTEROL HCL 0.63 MG/3ML IN NEBU: 0.6300 mg | INHALATION_SOLUTION | Freq: Four times a day (QID) | RESPIRATORY_TRACT | Status: DC | PRN

## 2016-07-08 MED ORDER — LEVALBUTEROL HCL 0.63 MG/3ML IN NEBU
0.6300 mg | INHALATION_SOLUTION | Freq: Three times a day (TID) | RESPIRATORY_TRACT | Status: DC
  Administered 2016-07-08: 0.63 mg via RESPIRATORY_TRACT
  Filled 2016-07-08: qty 3

## 2016-07-08 NOTE — Progress Notes (Signed)
PROGRESS NOTE    Lisa Mcknight  ZYS:[id number] DOB: [birth year] DOA: 07/07/2016 PCP: Patient, No Pcp Per   Brief Narrative:  Lisa Mcknight is a 71 y.o. female with medical history significant for hypertension, diastolic CHF. Patient presented today with complaints of shortness of breath that started 2-3 days ago- present at rest but more significant with exertion. Patient had a cold- with congestion, cough productive of mucoid sputum- about 2 weeks ago which she says has resolved.  She denies fever or chills. No chest pain, no leg swelling redness or warmth. No recent travels no personal or family history of blood clots, no weight loss. Patient quit smoking about 5 years ago, half a pack per day for ~50 years. Patient has not seen a primary care provider in this for years.Patient initially required O2, and With ambulation patient became SOB, with O2 sat dropping to 90%. Patient was given Ceftriaxone and Levaquin for Pneumonia. She was admitted for her SOB and Right Pleural Effusion and Suspected PNA. She underwent a thoracentesis this AM and is slowly improving.   Assessment & Plan:   Principal Problem:   SOB (shortness of breath) Active Problems:   Hypertension   Community acquired bacterial pneumonia   Tachycardia   Pleural effusion   Thyroid mass  Shortness of Breath 2/2 to Suspected CAP and Moderate Right Pleural Effusion and Small , improving s/p Right Thoracentesis  -Significant compared to the degree of pneumonia noted on CTA. -WBC was 11.1 and improved to 8.9. -Was given 1 Liter bolus in ED -IV ceftriaxone and Levaquin given in the ED. -Admitted to inpatient telemetry -Due to concern for PE with persistent tachycardia, and chest x-ray not very suggestive of CAP, obtained CTA to rule out PE, and evaluate effusion -> Bilateral bronchiectasis is noted with patchy airspace opacities in the left lower lobe consistent with atelectasis. Small foci of pneumonia are not entirely excluded.  Also showed no Acute PE -Continue IV antibiotics with Ceftriaxone and Azithromycin -Interventional Radiology Consulted for Thoracentesis and patient had 600 mL of Hazy, Yellow Fluid drawn off -C/w Guaifenesin-Dextromethorphan 5 mL po q6h -Added Xopenex/Ipratropium TID and Xopenex q6hprn -Also Added Flutter Valve and Incentive Spirometry   Sepsis 2/2 to CAP -Patient Met Sepsis Criteria with  tachycardia, tachypnea and source of Infection -Sepsis Physiology is improving  -Small foci of pneumonia are not entirely excluded on CT chest. -WBC went from 11.1 -> 8.9. -IV antibiotics as above. -Check lactic acid X1. -IVF with NS + 20 mEQ of KCl at 100 mL/hr discontinued. Will restart IVF with NS at 50 mL/hr -Treatment as Above  Thyroid mass -Incidental finding on CTA chest.  -Nonemergent thyroid ultrasound recommended and can be done as an outpatient  -? Relationship of thyroid disease to pleural effusions -TSH was 0.641; Free T4 and Free T3 were pending .  SinusTachycardia-  -More likely related to effusion with subsequent shortness of breath, also possibly 2/2 CAP with sepsis, or dehydration- mild elevated creatinine 0.64, baseline- 0.4. -Hydrated with NS +20 KCL x12 hrs, especially after contrast exposure. -Will restart IV Hydration with NS at 50 mL/hr  -Continue to Monitor on Telemetry  Hypertension, improved  -Patient's stopped her antihypertensives- ?names about 5 years ago- said it didn't make a difference with her blood pressure. -PRN hydralazine '5mg'$  >190/110, as pt BP is likely chronically elevated. -Started Amlodipine 5 mg po Daily  Thrombocytosis -Likely 2/2 to Infection -Platelet Count went from 457 -> 403 -Repeat CBC in AM  Mildly Elevated AST -Likely  Reactive -Continue to Monitor and Repeat CMP in AM  DVT prophylaxis: SCDs Code Status: FULL CODE Family Communication: Discussed with Sons at bedside Disposition Plan: Remain Inpatient and Likely D/C Home in 1-2  Days  Consultants:   Interventional Radiology   Procedures:  Right Thoracentesis    Antimicrobials:  Anti-infectives    Start     Dose/Rate Route Frequency Ordered Stop   07/08/16 1600  azithromycin (ZITHROMAX) 500 mg in dextrose 5 % 250 mL IVPB     500 mg 250 mL/hr over 60 Minutes Intravenous Every 24 hours 07/07/16 2240     07/08/16 1500  cefTRIAXone (ROCEPHIN) 1 g in dextrose 5 % 50 mL IVPB     1 g 100 mL/hr over 30 Minutes Intravenous Every 24 hours 07/07/16 2240     07/08/16 0000  levofloxacin (LEVAQUIN) 750 MG tablet     750 mg Oral Daily 07/07/16 1524 07/12/16 2359   07/07/16 1500  cefTRIAXone (ROCEPHIN) 1 g in dextrose 5 % 50 mL IVPB     1 g 100 mL/hr over 30 Minutes Intravenous  Once 07/07/16 1446 07/07/16 1557   07/07/16 1500  levofloxacin (LEVAQUIN) tablet 750 mg     750 mg Oral  Once 07/07/16 1451 07/07/16 1557     Subjective: Seen and examined and stated it was a little easier to breathe. Denied any nausea or vomiting. Stated she was around her son who was sick recently. Denied any CP. No other complaints or concerns at this time.   Objective: Vitals:   07/08/16 1041 07/08/16 1046 07/08/16 1410 07/08/16 1444  BP: (!) 167/83 139/74  126/74  Pulse:    (!) 118  Resp:    20  Temp:    98.5 F (36.9 C)  TempSrc:    Oral  SpO2:   93% 95%  Weight:      Height:        Intake/Output Summary (Last 24 hours) at 07/08/16 1658 Last data filed at 07/08/16 0200  Gross per 24 hour  Intake           133.33 ml  Output                0 ml  Net           133.33 ml   Filed Weights   07/07/16 1230  Weight: 56.7 kg (125 lb)   Examination: Physical Exam:  Constitutional: Thin Caucasian female in NAD and appears calm and comfortable Eyes: Lids and conjunctivae normal, sclerae anicteric  ENMT: External Ears, Nose appear normal. Grossly normal hearing. Mucous membranes are slightly dry.  Neck: Appears normal, supple, no cervical masses, normal ROM, no appreciable  thyromegaly, no JVD Respiratory: Diminished to auscultation bilaterally at the bases, Mild Crackles appreciated but no appreciable wheezing or rhonchi. Slightly increased respiratory effort. No accessory muscle use. Patient not wearing any supplemental O2 Cardiovascular: Tachycardic Rate and Rhythm, no murmurs / rubs / gallops. S1 and S2 auscultated. No extremity edema.  Abdomen: Soft, non-tender, non-distended. No masses palpated. No appreciable hepatosplenomegaly. Bowel sounds positive x4.  GU: Deferred. Musculoskeletal: No clubbing / cyanosis of digits/nails. No joint deformity upper and lower extremities.  Skin: No rashes, lesions, ulcers on a limited skin evaluation. No induration; Warm and dry.  Neurologic: CN 2-12 grossly intact with no focal deficits. Romberg sign cerebellar reflexes not assessed.  Psychiatric: Normal judgment and insight. Alert and oriented x 3. Normal mood and appropriate affect.   Data Reviewed: I have personally  reviewed following labs and imaging studies  CBC:  Recent Labs Lab 07/07/16 1250 07/08/16 0525  WBC 11.1* 8.9  NEUTROABS 8.7*  --   HGB 15.6* 14.5  HCT 45.5 43.1  MCV 89.2 91.5  PLT 457* 544*   Basic Metabolic Panel:  Recent Labs Lab 07/07/16 1250 07/08/16 0525  NA 136 140  K 3.6 3.8  CL 103 106  CO2 23 23  GLUCOSE 136* 92  BUN 14 6  CREATININE 0.62 0.45  CALCIUM 10.0 9.1   GFR: Estimated Creatinine Clearance: 56.9 mL/min (by C-G formula based on SCr of 0.45 mg/dL). Liver Function Tests:  Recent Labs Lab 07/07/16 1250  AST 43*  ALT 49  ALKPHOS 161*  BILITOT 0.4  PROT 8.2*  ALBUMIN 3.8   No results for input(s): LIPASE, AMYLASE in the last 168 hours. No results for input(s): AMMONIA in the last 168 hours. Coagulation Profile: No results for input(s): INR, PROTIME in the last 168 hours. Cardiac Enzymes:  Recent Labs Lab 07/07/16 1250  TROPONINI <0.03   BNP (last 3 results) No results for input(s): PROBNP in the last  8760 hours. HbA1C: No results for input(s): HGBA1C in the last 72 hours. CBG: No results for input(s): GLUCAP in the last 168 hours. Lipid Profile: No results for input(s): CHOL, HDL, LDLCALC, TRIG, CHOLHDL, LDLDIRECT in the last 72 hours. Thyroid Function Tests:  Recent Labs  07/07/16 1217  TSH 0.641   Anemia Panel: No results for input(s): VITAMINB12, FOLATE, FERRITIN, TIBC, IRON, RETICCTPCT in the last 72 hours. Sepsis Labs:  Recent Labs Lab 07/07/16 2259  LATICACIDVEN 1.0    No results found for this or any previous visit (from the past 240 hour(s)).   Radiology Studies: Dg Chest 1 View  Result Date: 07/08/2016 CLINICAL DATA:  Status post right-sided thoracentesis EXAM: CHEST 1 VIEW COMPARISON:  July 07, 2016 chest radiograph and chest CT FINDINGS: There has been interval resolution of right-sided pleural effusion following thoracentesis. There is a small pleural effusion on the left. There is no appreciable pneumothorax. Localized air is noted in the lateral right base, likely from the thoracentesis procedure. There is slight bibasilar atelectasis. There is stable scarring in the apices. No edema or consolidation. Heart size is within normal limits. Pulmonary vascularity is normal. No adenopathy. There is aortic atherosclerosis. Bones are osteoporotic. IMPRESSION: Resolution of right-sided pleural effusion following thoracentesis. Small loculated air in the lateral right base region is likely a consequence of the thoracentesis. There is no frank pneumothorax. There is scarring in the apices. There is mild bibasilar atelectasis. Cardiac silhouette is stable. There is aortic atherosclerosis. Electronically Signed   By: Lowella Grip III M.D.   On: 07/08/2016 10:58   Dg Chest 2 View  Result Date: 07/07/2016 CLINICAL DATA:  Shortness of breath.  Cough. EXAM: CHEST  2 VIEW COMPARISON:  01/23/2011 FINDINGS: The patient has a small left pleural effusion and a moderate right pleural  effusion. Overall heart size and pulmonary vascularity are normal. There is chronic accentuation of the interstitial markings with no consolidative infiltrate or effusion. Scarring at the lung apices.  Aortic atherosclerosis. IMPRESSION: Moderate right pleural effusion, nonspecific. Small left pleural effusion, nonspecific. Chronic slight accentuation of the interstitial markings without acute infiltrate. Electronically Signed   By: Lorriane Shire M.D.   On: 07/07/2016 14:00   Ct Angio Chest Pe W Or Wo Contrast  Result Date: 07/07/2016 CLINICAL DATA:  Dyspnea starting 1 week ago. Endorses upper respiratory symptoms nasal congestion and  sneezing as well as productive cough. EXAM: CT ANGIOGRAPHY CHEST WITH CONTRAST TECHNIQUE: Multidetector CT imaging of the chest was performed using the standard protocol during bolus administration of intravenous contrast. Multiplanar CT image reconstructions and MIPs were obtained to evaluate the vascular anatomy. CONTRAST:  80 cc Isovue 370 IV COMPARISON:  01/13/2011 FINDINGS: Cardiovascular: Normal branch pattern of the great vessels off the aortic arch. There is aortic atherosclerosis without aneurysm or dissection. Coronary arteriosclerosis is noted. Visualized cardiac chambers are normal in size. No pericardial effusion or thickening. Mediastinum/Nodes: No enlarged mediastinal, hilar, or axillary lymph nodes. Asymmetric stable enlargement of the right lobe of the thyroid gland with partially cystic, partially solid appearing 2.9 x 1.8 x 2.7 cm nodule, more conspicuous than on prior exam and may be better characterized with ultrasound. Left lobe is not visualized and may be surgically absent. No the trachea mainstem bronchi are unremarkable. The esophagus is within normal limits. No mediastinal nor hilar lymphadenopathy. Lungs/Pleura: Moderate right and small left pleural effusions with compressive atelectasis. There is perihilar bronchiectasis. Respiratory motion artifact  slightly limit assessment of the lungs especially in the upper lobes. Streaky airspace opacities are noted along the left lower lobe bronchi which may reflect subsegmental atelectasis. Small foci of pneumonia are not entirely excluded. Upper Abdomen: No acute abnormality. Musculoskeletal: Superior endplate Schmorl's node noted at T10 with mild compression of T11 since 2012, likely remote. No retropulsion. The bones are demineralized. Review of the MIP images confirms the above findings. IMPRESSION: 1. Moderate right and small left pleural effusions with adjacent compressive atelectasis. 2. Bilateral bronchiectasis is noted with patchy airspace opacities in the left lower lobe consistent with atelectasis. Small foci of pneumonia are not entirely excluded. 3. **An incidental finding of potential clinical significance has been found. 2.9 x 1.8 x 2.7 cm partially solid and partially cystic right thyroid mass, more conspicuous on current exam. This may benefit from further correlation with nonemergent ultrasound** 4. Aortic atherosclerosis and minimal coronary arteriosclerosis. 5. No acute pulmonary embolus. Electronically Signed   By: Ashley Royalty M.D.   On: 07/07/2016 21:05   US Thoracentesis Asp Pleural Space W/img Guide  Result Date: 07/08/2016 INDICATION: Patient with shortness of breath is found to have right pleural effusion. Request is made for diagnostic and therapeutic thoracentesis. EXAM: ULTRASOUND GUIDED RIGHT THORACENTESIS MEDICATIONS: 10 mL 1% lidocaine COMPLICATIONS: SIR Level A - No therapy, no consequence. Postprocedural chest radiograph demonstrates development of a tiny asymptomatic basilar ex vacuo pneumothorax. PROCEDURE: An ultrasound guided thoracentesis was thoroughly discussed with the patient and questions answered. The benefits, risks, alternatives and complications were also discussed. The patient understands and wishes to proceed with the procedure. Written consent was obtained. Ultrasound  was performed to localize and mark an adequate pocket of fluid in the right chest. The area was then prepped and draped in the normal sterile fashion. 1% Lidocaine was used for local anesthesia. Under ultrasound guidance a Safe-T-Centesis catheter was introduced. Thoracentesis was performed. The catheter was removed and a dressing applied. FINDINGS: A total of approximately 600 mL of hazy, yellow fluid was removed. Samples were sent to the laboratory as requested by the clinical team. IMPRESSION: Successful ultrasound guided right thoracentesis yielding 600 mL of pleural fluid. Read by:  Brynda Greathouse PA-C Electronically Signed   By: Sandi Mariscal M.D.   On: 07/08/2016 11:03   Scheduled Meds: . amLODipine  5 mg Oral Daily  . enoxaparin (LOVENOX) injection  40 mg Subcutaneous QHS  . guaiFENesin-dextromethorphan  5  mL Oral Q6H  . ipratropium  0.5 mg Nebulization TID  . levalbuterol  0.63 mg Nebulization TID   Continuous Infusions: . sodium chloride    . azithromycin 500 mg (07/08/16 1638)  . cefTRIAXone (ROCEPHIN)  IV Stopped (07/08/16 1639)    LOS: 1 day   Kerney Elbe, DO [facility] Pager [phone]  If 7PM-7AM, please contact night-coverage [email or web address] Password [email or web address] 07/08/2016, 4:58 PM

## 2016-07-08 NOTE — Procedures (Signed)
PROCEDURE SUMMARY:  Successful US guided right diganostic and therapeutic thoracentesis. Yielded 600 mL of hazy, yellow fluid. Pt tolerated procedure well. No immediate complications.  Specimen was sent for labs. CXR ordered.  Docia Barrier PA-C 07/08/2016 10:48 AM

## 2016-07-09 ENCOUNTER — Inpatient Hospital Stay (HOSPITAL_COMMUNITY): Payer: Medicare HMO

## 2016-07-09 DIAGNOSIS — R0600 Dyspnea, unspecified: Secondary | ICD-10-CM

## 2016-07-09 LAB — ECHOCARDIOGRAM COMPLETE
CHL CUP RV SYS PRESS: 32 mmHg
E decel time: 95 msec
EERAT: 13.73
FS: 20 % — AB (ref 28–44)
HEIGHTINCHES: 65 in
IV/PV OW: 1.13
LA diam end sys: 30 mm
LA diam index: 1.85 cm/m2
LA vol A4C: 22.4 ml
LA vol index: 19.3 mL/m2
LA vol: 31.2 mL
LASIZE: 30 mm
LDCA: 2.84 cm2
LV E/e'average: 13.73
LVEEMED: 13.73
LVOT diameter: 19 mm
MV Dec: 95
MVPG: 6 mmHg
MVPKEVEL: 121 m/s
PW: 10.9 mm — AB (ref 0.6–1.1)
Reg peak vel: 267 cm/s
TAPSE: 30 mm
TDI e' medial: 8.81
TRMAXVEL: 267 cm/s
WEIGHTICAEL: 2000 [oz_av]

## 2016-07-09 LAB — MAGNESIUM: MAGNESIUM: 2.1 mg/dL (ref 1.7–2.4)

## 2016-07-09 LAB — COMPREHENSIVE METABOLIC PANEL
ALT: 33 U/L (ref 14–54)
ANION GAP: 11 (ref 5–15)
AST: 27 U/L (ref 15–41)
Albumin: 3.3 g/dL — ABNORMAL LOW (ref 3.5–5.0)
Alkaline Phosphatase: 124 U/L (ref 38–126)
BUN: 10 mg/dL (ref 6–20)
CHLORIDE: 107 mmol/L (ref 101–111)
CO2: 23 mmol/L (ref 22–32)
Calcium: 9.2 mg/dL (ref 8.9–10.3)
Creatinine, Ser: 0.53 mg/dL (ref 0.44–1.00)
GFR calc Af Amer: >60 mL/min (ref >60)
Glucose, Bld: 92 mg/dL (ref 65–99)
POTASSIUM: 3.8 mmol/L (ref 3.5–5.1)
Sodium: 141 mmol/L (ref 135–145)
Total Bilirubin: 0.5 mg/dL (ref 0.3–1.2)
Total Protein: 6.7 g/dL (ref 6.5–8.1)

## 2016-07-09 LAB — CBC WITH DIFFERENTIAL/PLATELET
BASOS ABS: 0.1 10*3/uL (ref 0.0–0.1)
BASOS PCT: 1 %
EOS PCT: 3 %
Eosinophils Absolute: 0.3 10*3/uL (ref 0.0–0.7)
HCT: 45.9 % (ref 36.0–46.0)
Hemoglobin: 15.2 g/dL — ABNORMAL HIGH (ref 12.0–15.0)
Lymphocytes Relative: 20 %
Lymphs Abs: 1.8 10*3/uL (ref 0.7–4.0)
MCH: 30.5 pg (ref 26.0–34.0)
MCHC: 33.1 g/dL (ref 30.0–36.0)
MCV: 92 fL (ref 78.0–100.0)
MONO ABS: 1.1 10*3/uL — AB (ref 0.1–1.0)
Monocytes Relative: 12 %
Neutro Abs: 6.1 10*3/uL (ref 1.7–7.7)
Neutrophils Relative %: 64 %
PLATELETS: 369 10*3/uL (ref 150–400)
RBC: 4.99 MIL/uL (ref 3.87–5.11)
RDW: 13.6 % (ref 11.5–15.5)
WBC: 9.4 10*3/uL (ref 4.0–10.5)

## 2016-07-09 LAB — PHOSPHORUS: PHOSPHORUS: 3.3 mg/dL (ref 2.5–4.6)

## 2016-07-09 LAB — PH, BODY FLUID: pH, Body Fluid: 7.5

## 2016-07-09 LAB — STREP PNEUMONIAE URINARY ANTIGEN: STREP PNEUMO URINARY ANTIGEN: NEGATIVE

## 2016-07-09 MED ORDER — GUAIFENESIN-DM 100-10 MG/5ML PO SYRP: 5.0000 mL | ORAL_SOLUTION | Freq: Four times a day (QID) | ORAL | 0 refills | Status: DC

## 2016-07-09 MED ORDER — AMLODIPINE BESYLATE 5 MG PO TABS: 5.0000 mg | ORAL_TABLET | Freq: Every day | ORAL | 0 refills | Status: DC

## 2016-07-09 MED ORDER — AZITHROMYCIN 250 MG PO TABS: 500.0000 mg | ORAL_TABLET | Freq: Every day | ORAL | Status: DC

## 2016-07-09 MED ORDER — LEVALBUTEROL HCL 0.63 MG/3ML IN NEBU: 0.6300 mg | INHALATION_SOLUTION | Freq: Four times a day (QID) | RESPIRATORY_TRACT | Status: DC | PRN

## 2016-07-09 MED ORDER — ONDANSETRON HCL 4 MG PO TABS: 4.0000 mg | ORAL_TABLET | Freq: Four times a day (QID) | ORAL | 0 refills | Status: DC | PRN

## 2016-07-09 MED ORDER — AZITHROMYCIN 250 MG PO TABS: 500.0000 mg | ORAL_TABLET | Freq: Every day | ORAL | 0 refills | Status: DC

## 2016-07-09 MED ORDER — CEFPODOXIME PROXETIL 200 MG PO TABS: 200.0000 mg | ORAL_TABLET | Freq: Two times a day (BID) | ORAL | 0 refills | Status: DC

## 2016-07-09 NOTE — Discharge Summary (Signed)
Physician Discharge Summary  Iysis Lisa Mcknight:[id number] DOB: 1945-06-10 DOA: 07/07/2016  PCP: Patient, No Pcp Per  Admit date: 07/07/2016 Discharge date: 07/09/2016  Admitted From: Home Disposition:  Home  Recommendations for Outpatient Follow-up:  1. Follow up with and establish with PCP in 1-2 weeks at [facility] 2. Please obtain CMP/CBC, Mag, Phos in one week 3. Repeat CXR in 3-6 weeks 4. Follow up as an outpatient for Thyroid Mass Evaluation with Ultrasound 5. Please follow up on the following pending results:  -Follow up on Strep Ur and Legionella Antigens; Follow up on Free T4, and Free T3 Levels that were pending   Home Health: No Equipment/Devices: None   Discharge Condition: Stable  CODE STATUS: FULL CODE Diet recommendation: Heart Healthy  Brief/Interim Summary: Lisa Mcknight a 71 y.o.femalewith medical history significant for hypertension, diastolic CHF. Patient presented today with complaints of shortness of breath that started 2-3 days ago- present at rest but more significant with exertion. Patient had a cold- with congestion, cough productive of mucoid sputum- about 2 weeks ago which she says has resolved. She denied fever or chills. No chest pain, no leg swelling redness or warmth. No recent travels no personal or family history of blood clots, no weight loss. Patient quit smoking about 5 years ago, half a pack per day for ~50years. Patient has not seen a primary care provider in this for years. Patient initially required O2, and With ambulation patient became SOB, with O2 sat droppingto 90%. Patient was given Ceftriaxone and Levaquin for Pneumonia. She was admitted for her SOB and Right Pleural Effusion and Suspected PNA. She underwent a thoracentesis on the AM of 07/08/16 and is improved. She underwent an ECHOCardiogram this AM. She was deemed medically stable and will D/C Home and need to follow up with and establish with a PCP within 1-2 weeks and repeat CXR in 3-6  weeks.  Discharge Diagnoses:  Principal Problem:   SOB (shortness of breath) Active Problems:   Hypertension   Community acquired bacterial pneumonia   Tachycardia   Pleural effusion   Thyroid mass  Shortness of Breath 2/2 to Suspected CAP and Moderate Right Pleural Effusion and Small , improved s/p Right Thoracentesis  -Significantcompared to the degree of pneumonia noted on CTA. -WBC was 11.1 and improved to 9.4. -Was given 1 Liter bolus in ED -IV ceftriaxone and Levaquin given in the ED. -Admitted to inpatient telemetry -Due to concern for PE with persistent tachycardia, and chest x-ray not very suggestive of CAP, obtained CTA to rule out PE, and evaluate effusion -> Bilateral bronchiectasis is noted with patchy airspace opacities in the left lower lobe consistent with atelectasis. Small foci of pneumonia are not entirely excluded. Also showed no Acute PE -IV antibiotics with Ceftriaxone and Azithromycin change to po Vantin and Azithromycin -Interventional Radiology Consulted for Thoracentesis and patient had 600 mL of Hazy, Yellow Fluid drawn off -C/w Guaifenesin-Dextromethorphan 5 mL po q6h -Added Xopenex/Ipratropium TID and Xopenex q6hprn while hospitalized -Also Added Flutter Valve and Incentive Spirometry  -ECHOCardiogram for completeness showed Systolic function was   vigorous. The estimated ejection fraction was in the range of 65%   to 70%. PA pressure was 32. -Follow up with PCP as an outpatient   Sepsis 2/2 to CAP, improved -Patient Met Sepsis Criteria with  tachycardia, tachypnea and source of Infection -Sepsis Physiology is improved -Small foci of pneumonia are not entirely excluded on CT chest. -WBC went from 11.1 -> 8.9 -> 9.4 -IV Abx changed to po  Azithromycin and po Vantin for 4 more days total -Check lactic acid X1. -IVF while hospitalized -Treatment as Above -Follow up with PCP   Thyroid mass -Incidental finding on CTA chest.  -Nonemergent thyroid  ultrasound recommended and can be done as an outpatient  -?Relationship of thyroid disease to pleural effusions -TSH was 0.641; Free T4 and Free T3 were pending .  SinusTachycardia-  -More likely related to effusion with subsequent shortness of breath, also possibly 2/2 CAP with sepsis, or dehydration- mild elevated creatinine0.64, baseline- 0.4. -Hydrated with NS +20 KCL x12 hrs, especially after contrast exposure. -Given IV Hydration with NS at 50 mL/hr after initial hydration -Monitored on Telemetry -Improved  Hypertension, improved  -Patient's stopped her antihypertensives- ?namesabout 5 years ago- said it didn't make a difference with her blood pressure. -PRN hydralazine '5mg'$  >190/110, as pt BP is likely chronically elevated. -Started Amlodipine 5 mg po Daily and will continue at D/C  Thrombocytosis, improved -Likely 2/2 to Infection -Platelet Count went from 457 -> 403 -> 369 -Repeat CBC at PCP office  Mildly Elevated AST -Likely Reactive, and now is improved -Continue to Monitor and Repeat CMP as an outpatient   Discharge Instructions  Discharge Instructions    Call MD for:  difficulty breathing, headache or visual disturbances    Complete by:  As directed    Call MD for:  extreme fatigue    Complete by:  As directed    Call MD for:  persistant dizziness or light-headedness    Complete by:  As directed    Call MD for:  persistant nausea and vomiting    Complete by:  As directed    Call MD for:  severe uncontrolled pain    Complete by:  As directed    Call MD for:  temperature >100.4    Complete by:  As directed    Diet - low sodium heart healthy    Complete by:  As directed    Discharge instructions    Complete by:  As directed    Follow up with and establish with  PCP next week and take all medications as prescribed. If symptoms change or worsen please return to the ED for evaluation. Please repeat CXR in 3-6 weeks   Increase activity slowly    Complete by:   As directed      Allergies as of 07/09/2016   No Known Allergies     Medication List    TAKE these medications   amLODipine 5 MG tablet Commonly known as:  NORVASC Take 1 tablet (5 mg total) by mouth daily. Start taking on:  07/10/2016   azithromycin 250 MG tablet Commonly known as:  ZITHROMAX Take 2 tablets (500 mg total) by mouth daily. Take for total of 4 more Days. Start taking on:  07/10/2016   cefpodoxime 200 MG tablet Commonly known as:  VANTIN Take 1 tablet (200 mg total) by mouth 2 (two) times daily. Start taking on:  07/10/2016   guaiFENesin-dextromethorphan 100-10 MG/5ML syrup Commonly known as:  ROBITUSSIN DM Take 5 mLs by mouth every 6 (six) hours.   multivitamins ther. w/minerals Tabs tablet Take 1 tablet by mouth daily.   ondansetron 4 MG tablet Commonly known as:  ZOFRAN Take 1 tablet (4 mg total) by mouth every 6 (six) hours as needed for nausea.      Follow-up Information    [place] DE[facility] Follow up.   Specialty:  Emergency Medicine Why:  As needed, If symptoms  worsen Contact information: [place] [id number] [place]       Primary Care at [facility] Follow up.   Specialty:  Family Medicine Why:  please call arrange to arrange appointment  Contact information: [place] [phone]         No Known Allergies  Consultations:  Care Management  Interventional Radiology  Procedures/Studies: Dg Chest 1 View  Result Date: 07/08/2016 CLINICAL DATA:  Status post right-sided thoracentesis EXAM: CHEST 1 VIEW COMPARISON:  July 07, 2016 chest radiograph and chest CT FINDINGS: There has been interval resolution of right-sided pleural effusion following thoracentesis. There is a small pleural effusion on the left. There is no appreciable pneumothorax. Localized air is noted in the lateral right base, likely from the  thoracentesis procedure. There is slight bibasilar atelectasis. There is stable scarring in the apices. No edema or consolidation. Heart size is within normal limits. Pulmonary vascularity is normal. No adenopathy. There is aortic atherosclerosis. Bones are osteoporotic. IMPRESSION: Resolution of right-sided pleural effusion following thoracentesis. Small loculated air in the lateral right base region is likely a consequence of the thoracentesis. There is no frank pneumothorax. There is scarring in the apices. There is mild bibasilar atelectasis. Cardiac silhouette is stable. There is aortic atherosclerosis. Electronically Signed   By: Lowella Grip III M.D.   On: 07/08/2016 10:58   Dg Chest 2 View  Result Date: 07/09/2016 CLINICAL DATA:  Shortness of breath EXAM: CHEST  2 VIEW COMPARISON:  07/08/2016 FINDINGS: Mild bilateral lower lobe opacities, atelectasis versus pneumonia. Small bilateral pleural effusions. No frank interstitial edema.  No pneumothorax is seen. The heart is normal in size. IMPRESSION: Mild bilateral lower lobe opacities, atelectasis versus pneumonia. Small bilateral pleural effusions. Electronically Signed   By: Julian Hy M.D.   On: 07/09/2016 11:57   Dg Chest 2 View  Result Date: 07/07/2016 CLINICAL DATA:  Shortness of breath.  Cough. EXAM: CHEST  2 VIEW COMPARISON:  01/23/2011 FINDINGS: The patient has a small left pleural effusion and a moderate right pleural effusion. Overall heart size and pulmonary vascularity are normal. There is chronic accentuation of the interstitial markings with no consolidative infiltrate or effusion. Scarring at the lung apices.  Aortic atherosclerosis. IMPRESSION: Moderate right pleural effusion, nonspecific. Small left pleural effusion, nonspecific. Chronic slight accentuation of the interstitial markings without acute infiltrate. Electronically Signed   By: Lorriane Shire M.D.   On: 07/07/2016 14:00   Ct Angio Chest Pe W Or Wo  Contrast  Result Date: 07/07/2016 CLINICAL DATA:  Dyspnea starting 1 week ago. Endorses upper respiratory symptoms nasal congestion and sneezing as well as productive cough. EXAM: CT ANGIOGRAPHY CHEST WITH CONTRAST TECHNIQUE: Multidetector CT imaging of the chest was performed using the standard protocol during bolus administration of intravenous contrast. Multiplanar CT image reconstructions and MIPs were obtained to evaluate the vascular anatomy. CONTRAST:  80 cc Isovue 370 IV COMPARISON:  01/13/2011 FINDINGS: Cardiovascular: Normal branch pattern of the great vessels off the aortic arch. There is aortic atherosclerosis without aneurysm or dissection. Coronary arteriosclerosis is noted. Visualized cardiac chambers are normal in size. No pericardial effusion or thickening. Mediastinum/Nodes: No enlarged mediastinal, hilar, or axillary lymph nodes. Asymmetric stable enlargement of the right lobe of the thyroid gland with partially cystic, partially solid appearing 2.9 x 1.8 x 2.7 cm nodule, more conspicuous than on prior exam and may be better characterized with ultrasound. Left lobe is not visualized and may be surgically  absent. No the trachea mainstem bronchi are unremarkable. The esophagus is within normal limits. No mediastinal nor hilar lymphadenopathy. Lungs/Pleura: Moderate right and small left pleural effusions with compressive atelectasis. There is perihilar bronchiectasis. Respiratory motion artifact slightly limit assessment of the lungs especially in the upper lobes. Streaky airspace opacities are noted along the left lower lobe bronchi which may reflect subsegmental atelectasis. Small foci of pneumonia are not entirely excluded. Upper Abdomen: No acute abnormality. Musculoskeletal: Superior endplate Schmorl's node noted at T10 with mild compression of T11 since 2012, likely remote. No retropulsion. The bones are demineralized. Review of the MIP images confirms the above findings. IMPRESSION: 1.  Moderate right and small left pleural effusions with adjacent compressive atelectasis. 2. Bilateral bronchiectasis is noted with patchy airspace opacities in the left lower lobe consistent with atelectasis. Small foci of pneumonia are not entirely excluded. 3. **An incidental finding of potential clinical significance has been found. 2.9 x 1.8 x 2.7 cm partially solid and partially cystic right thyroid mass, more conspicuous on current exam. This may benefit from further correlation with nonemergent ultrasound** 4. Aortic atherosclerosis and minimal coronary arteriosclerosis. 5. No acute pulmonary embolus. Electronically Signed   By: Ashley Royalty M.D.   On: 07/07/2016 21:05   US Thoracentesis Asp Pleural Space W/img Guide  Result Date: 07/08/2016 INDICATION: Patient with shortness of breath is found to have right pleural effusion. Request is made for diagnostic and therapeutic thoracentesis. EXAM: ULTRASOUND GUIDED RIGHT THORACENTESIS MEDICATIONS: 10 mL 1% lidocaine COMPLICATIONS: SIR Level A - No therapy, no consequence. Postprocedural chest radiograph demonstrates development of a tiny asymptomatic basilar ex vacuo pneumothorax. PROCEDURE: An ultrasound guided thoracentesis was thoroughly discussed with the patient and questions answered. The benefits, risks, alternatives and complications were also discussed. The patient understands and wishes to proceed with the procedure. Written consent was obtained. Ultrasound was performed to localize and mark an adequate pocket of fluid in the right chest. The area was then prepped and draped in the normal sterile fashion. 1% Lidocaine was used for local anesthesia. Under ultrasound guidance a Safe-T-Centesis catheter was introduced. Thoracentesis was performed. The catheter was removed and a dressing applied. FINDINGS: A total of approximately 600 mL of hazy, yellow fluid was removed. Samples were sent to the laboratory as requested by the clinical team. IMPRESSION:  Successful ultrasound guided right thoracentesis yielding 600 mL of pleural fluid. Read by:  Brynda Greathouse PA-C Electronically Signed   By: Sandi Mariscal M.D.   On: 07/08/2016 11:03    ECHOCARDIOGRAM Study Conclusions  - Left ventricle: The cavity size was normal. Wall thickness was   increased in a pattern of mild LVH. Systolic function was   vigorous. The estimated ejection fraction was in the range of 65%   to 70%. - Aortic valve: Calcified non coronary cusp. - Atrial septum: No defect or patent foramen ovale was identified. - Pulmonary arteries: PA peak pressure: 32 mm Hg (S).  Subjective: Seen and examined at bedside and was feeling better and breathing better. Denied any CP or SOB and wanted to go home.   Discharge Exam: Vitals:   07/09/16 0446 07/09/16 1515  BP: (!) 144/70 130/62  Pulse: 95 (!) 105  Resp: 18 18  Temp: 97.8 F (36.6 C) 97.6 F (36.4 C)   Vitals:   07/08/16 2048 07/08/16 2117 07/09/16 0446 07/09/16 1515  BP: (!) 148/77  (!) 144/70 130/62  Pulse:   95 (!) 105  Resp: '18  18 18  '$ Temp: 97.8 F (  36.6 C)  97.8 F (36.6 C) 97.6 F (36.4 C)  TempSrc: Oral  Oral Oral  SpO2: 95% 92% 96% 96%  Weight:      Height:       General: Pt is alert, awake, not in acute distress Cardiovascular: Slightly tachycardic but regular rhythm, S1/S2 +, no rubs, no gallops Respiratory: Diminished bilaterally at the bases, no wheezing, no rhonchi; Patient not tachypenic or using any accessory muscles to breathe Abdominal: Soft, NT, ND, bowel sounds + Extremities: no edema, no cyanosis  The results of significant diagnostics from this hospitalization (including imaging, microbiology, ancillary and laboratory) are listed below for reference.    Microbiology: Recent Results (from the past 240 hour(s))  Body fluid culture     Status: None (Preliminary result)   Collection Time: 07/08/16 10:24 AM  Result Value Ref Range Status   Specimen Description PLEURAL RIGHT  Final    Special Requests NONE  Final   Gram Stain NO WBC SEEN NO ORGANISMS SEEN   Final   Culture   Final    NO GROWTH < 24 HOURS Performed at [facility], [address] [place], [place] [address]    Report Status PENDING  Incomplete    Labs: BNP (last 3 results)  Recent Labs  07/07/16 1250  BNP 09.3   Basic Metabolic Panel:  Recent Labs Lab 07/07/16 1250 07/08/16 0525 07/09/16 0503  NA 136 140 141  K 3.6 3.8 3.8  CL 103 106 107  CO2 '23 23 23  '$ GLUCOSE 136* 92 92  BUN '14 6 10  '$ CREATININE 0.62 0.45 0.53  CALCIUM 10.0 9.1 9.2  MG  --   --  2.1  PHOS  --   --  3.3   Liver Function Tests:  Recent Labs Lab 07/07/16 1250 07/09/16 0503  AST 43* 27  ALT 49 33  ALKPHOS 161* 124  BILITOT 0.4 0.5  PROT 8.2* 6.7  ALBUMIN 3.8 3.3*   No results for input(s): LIPASE, AMYLASE in the last 168 hours. No results for input(s): AMMONIA in the last 168 hours. CBC:  Recent Labs Lab 07/07/16 1250 07/08/16 0525 07/09/16 0503  WBC 11.1* 8.9 9.4  NEUTROABS 8.7*  --  6.1  HGB 15.6* 14.5 15.2*  HCT 45.5 43.1 45.9  MCV 89.2 91.5 92.0  PLT 457* 403* 369   Cardiac Enzymes:  Recent Labs Lab 07/07/16 1250  TROPONINI <0.03   BNP: Invalid input(s): POCBNP CBG: No results for input(s): GLUCAP in the last 168 hours. D-Dimer No results for input(s): DDIMER in the last 72 hours. Hgb A1c No results for input(s): HGBA1C in the last 72 hours. Lipid Profile No results for input(s): CHOL, HDL, LDLCALC, TRIG, CHOLHDL, LDLDIRECT in the last 72 hours. Thyroid function studies  Recent Labs  07/07/16 1217  TSH 0.641   Anemia work up No results for input(s): VITAMINB12, FOLATE, FERRITIN, TIBC, IRON, RETICCTPCT in the last 72 hours. Urinalysis No results found for: COLORURINE, APPEARANCEUR, [place], [place], [place], [place], [place], [place], PROTEINUR, UROBILINOGEN, NITRITE, LEUKOCYTESUR Sepsis Labs Invalid input(s): PROCALCITONIN,  WBC,   LACTICIDVEN Microbiology Recent Results (from the past 240 hour(s))  Body fluid culture     Status: None (Preliminary result)   Collection Time: 07/08/16 10:24 AM  Result Value Ref Range Status   Specimen Description PLEURAL RIGHT  Final   Special Requests NONE  Final   Gram Stain NO WBC SEEN NO ORGANISMS SEEN   Final   Culture   Final  NO GROWTH < 24 HOURS Performed at [facility] [address], [place], [place] [address]    Report Status PENDING  Incomplete   Time coordinating discharge: 35 minutes  SIGNED:  Kerney Elbe, DO [facility] 07/09/2016, 4:37 PM Pager [phone]  If 7PM-7AM, please contact night-coverage [email or web address] Password [email or web address]

## 2016-07-09 NOTE — Progress Notes (Signed)
*  PRELIMINARY RESULTS* Echocardiogram 2D Echocardiogram has been performed.  Lisa Mcknight 07/09/2016, 3:49 PM

## 2016-07-09 NOTE — Care Management Note (Signed)
Case Management Note  Patient Details  Name: Lisa Mcknight MRN: [id number] Date of Birth: 09/06/1945  Subjective/Objective:    Shortness of breath, HTN, CAP                Action/Plan: Discharge Planning:  NCM spoke to pt and states she went to [facility] and was followed up by Dr Josepha Pigg but he since retired. States she prefers to stay with that group. Provided pt with list of physicians in that office to call and arrange follow up hospital appt.    Expected Discharge Date:  07/09/2016           Expected Discharge Plan:  Home/Self Care  In-House Referral:  NA  Discharge planning Services  CM Consult  Post Acute Care Choice:  NA Choice offered to:  NA  DME Arranged:  N/A DME Agency:  NA  HH Arranged:  NA HH Agency:  NA  Status of Service:  Completed, signed off  If discussed at [place] of Stay Meetings, dates discussed:    Additional Comments:  Erenest Rasher, RN 07/09/2016, 4:36 PM

## 2016-07-09 NOTE — Progress Notes (Signed)
PT Cancellation Note  Patient Details Name: Lisa Mcknight MRN: [id number] DOB: October 10, 1945   Cancelled :     Noted PT order and pt in with OT , observed pt walking in hallway and performing balance activities with no LOB with the OT. See OT note, no PT needs at this time , will sign off.   Clide Dales, PT Pager: [phone] 07/09/2016    [name], [name] 07/09/2016, 10:50 AM

## 2016-07-09 NOTE — Evaluation (Signed)
Occupational Therapy Evaluation Patient Details Name: Lisa Mcknight MRN: [id number] DOB: [birth year] Today's Date: 07/09/2016    History of Present Illness 71 yo female admitted with SOB s/p 07/08/16 underwent a thoracentesis  Past Medical History:  Diagnosis Date  . Hypertension   . Pneumonia       Clinical Impression   Patient evaluated by Occupational Therapy with no further acute OT needs identified. All education has been completed and the patient has no further questions. See below for any follow-up Occupational Therapy or equipment needs. OT to sign off. Thank you for referral.      Follow Up Recommendations  No OT follow up    Equipment Recommendations  Other (comment)    Recommendations for Other Services       Precautions / Restrictions Precautions Precautions: None Restrictions Weight Bearing Restrictions: No      Mobility Bed Mobility Overal bed mobility: Independent                Transfers Overall transfer level: Independent                    Balance Overall balance assessment: Independent                                         ADL either performed or assessed with clinical judgement   ADL Overall ADL's : Independent  pt demonstrates head turns, talking with ambulation / basic transfer. Normal gait velocity for age and 1 step demo. Pt with HR 158 upon return to room.                                            Vision         Perception     Praxis      Pertinent Vitals/Pain Pain Assessment: No/denies pain     Hand Dominance Right   Extremity/Trunk Assessment Upper Extremity Assessment Upper Extremity Assessment: Overall WFL for tasks assessed   Lower Extremity Assessment Lower Extremity Assessment: Overall WFL for tasks assessed   Cervical / Trunk Assessment Cervical / Trunk Assessment: Normal   Communication Communication Communication: No difficulties   Cognition  Arousal/Alertness: Awake/alert Behavior During Therapy: WFL for tasks assessed/performed Overall Cognitive Status: Within Functional Limits for tasks assessed                                     General Comments       Exercises     Shoulder Instructions      Home Living Family/patient expects to be discharged to:: Private residence Living Arrangements: Spouse/significant other Available Help at Discharge: Family;Available 24 hours/day Type of Home: House Home Access: Stairs to enter [employer] of Steps: 1 Entrance Stairs-Rails: None Home Layout: One level     Bathroom Shower/Tub: Occupational psychologist: Standard     Home Equipment: None          Prior Functioning/Environment Level of Independence: Independent                 OT Problem List:        OT Treatment/Interventions:      OT Goals(Current goals can be found in  the care plan section) Acute Rehab OT Goals Patient Stated Goal: to go home  OT Frequency:     Barriers to D/C:            Co-evaluation              AM-PAC PT "6 Clicks" Daily Activity     Outcome Measure Help from another person eating meals?: None Help from another person taking care of personal grooming?: None Help from another person toileting, which includes using toliet, bedpan, or urinal?: None Help from another person bathing (including washing, rinsing, drying)?: None Help from another person to put on and taking off regular upper body clothing?: None Help from another person to put on and taking off regular lower body clothing?: None 6 Click Score: 24   End of Session Equipment Utilized During Treatment: Gait belt  Activity Tolerance: Patient tolerated treatment well Patient left: in bed;with call bell/phone within reach;with family/visitor present  OT Visit Diagnosis: Unsteadiness on feet (R26.81)                Time: 6160-7371 OT Time Calculation (min): 15 min Charges:   OT General Charges $OT Visit: 1 Procedure OT Evaluation $OT Eval Low Complexity: 1 Procedure G-Codes:      Jeri Modena   OTR/L Pager: [phone] Office: [phone] .   Parke Poisson B 07/09/2016, 10:44 AM

## 2016-07-10 LAB — T3, FREE: T3, Free: 3 pg/mL (ref 2.0–4.4)

## 2016-07-10 LAB — T4: T4 TOTAL: 8.7 ug/dL (ref 4.5–12.0)

## 2016-07-11 LAB — LEGIONELLA PNEUMOPHILA SEROGP 1 UR AG: L. pneumophila Serogp 1 Ur Ag: NEGATIVE

## 2016-07-12 LAB — BODY FLUID CULTURE
Culture: NO GROWTH
GRAM STAIN: NONE SEEN

## 2017-01-03 DIAGNOSIS — R69 Illness, unspecified: Secondary | ICD-10-CM | POA: Diagnosis not present

## 2017-04-02 DIAGNOSIS — Z0100 Encounter for examination of eyes and vision without abnormal findings: Secondary | ICD-10-CM | POA: Diagnosis not present

## 2017-04-02 DIAGNOSIS — H5200 Hypermetropia, unspecified eye: Secondary | ICD-10-CM | POA: Diagnosis not present

## 2017-06-09 ENCOUNTER — Emergency Department (HOSPITAL_COMMUNITY): Payer: Medicare HMO

## 2017-06-09 ENCOUNTER — Other Ambulatory Visit: Payer: Self-pay

## 2017-06-09 ENCOUNTER — Emergency Department (HOSPITAL_COMMUNITY)
Admission: EM | Admit: 2017-06-09 | Discharge: 2017-06-09 | Disposition: A | Discharge status: Home / Self Care | Payer: Medicare HMO | Attending: Emergency Medicine | Admitting: Emergency Medicine

## 2017-06-09 ENCOUNTER — Encounter (HOSPITAL_COMMUNITY): Payer: Self-pay

## 2017-06-09 DIAGNOSIS — J189 Pneumonia, unspecified organism: Secondary | ICD-10-CM | POA: Insufficient documentation

## 2017-06-09 DIAGNOSIS — Z87891 Personal history of nicotine dependence: Secondary | ICD-10-CM | POA: Diagnosis not present

## 2017-06-09 DIAGNOSIS — I110 Hypertensive heart disease with heart failure: Secondary | ICD-10-CM | POA: Insufficient documentation

## 2017-06-09 DIAGNOSIS — Z79899 Other long term (current) drug therapy: Secondary | ICD-10-CM | POA: Insufficient documentation

## 2017-06-09 DIAGNOSIS — R0602 Shortness of breath: Secondary | ICD-10-CM | POA: Diagnosis not present

## 2017-06-09 DIAGNOSIS — I5032 Chronic diastolic (congestive) heart failure: Secondary | ICD-10-CM | POA: Diagnosis not present

## 2017-06-09 DIAGNOSIS — R05 Cough: Secondary | ICD-10-CM | POA: Diagnosis not present

## 2017-06-09 DIAGNOSIS — J181 Lobar pneumonia, unspecified organism: Secondary | ICD-10-CM

## 2017-06-09 LAB — CBC WITH DIFFERENTIAL/PLATELET
BASOS PCT: 0 %
Basophils Absolute: 0 10*3/uL (ref 0.0–0.1)
EOS ABS: 0 10*3/uL (ref 0.0–0.7)
Eosinophils Relative: 0 %
HCT: 50.7 % — ABNORMAL HIGH (ref 36.0–46.0)
HEMOGLOBIN: 17 g/dL — AB (ref 12.0–15.0)
LYMPHS ABS: 0.8 10*3/uL (ref 0.7–4.0)
Lymphocytes Relative: 5 %
MCH: 30.7 pg (ref 26.0–34.0)
MCHC: 33.5 g/dL (ref 30.0–36.0)
MCV: 91.7 fL (ref 78.0–100.0)
MONO ABS: 1.3 10*3/uL — AB (ref 0.1–1.0)
MONOS PCT: 9 %
Neutro Abs: 13 10*3/uL — ABNORMAL HIGH (ref 1.7–7.7)
Neutrophils Relative %: 86 %
Platelets: 388 10*3/uL (ref 150–400)
RBC: 5.53 MIL/uL — ABNORMAL HIGH (ref 3.87–5.11)
RDW: 13.4 % (ref 11.5–15.5)
WBC: 15.1 10*3/uL — ABNORMAL HIGH (ref 4.0–10.5)

## 2017-06-09 LAB — BASIC METABOLIC PANEL
Anion gap: 13 (ref 5–15)
BUN: 8 mg/dL (ref 6–20)
CALCIUM: 9.6 mg/dL (ref 8.9–10.3)
CHLORIDE: 98 mmol/L — AB (ref 101–111)
CO2: 23 mmol/L (ref 22–32)
CREATININE: 0.49 mg/dL (ref 0.44–1.00)
GFR calc non Af Amer: >60 mL/min (ref >60)
Glucose, Bld: 100 mg/dL — ABNORMAL HIGH (ref 65–99)
Potassium: 4.2 mmol/L (ref 3.5–5.1)
SODIUM: 134 mmol/L — AB (ref 135–145)

## 2017-06-09 LAB — I-STAT CG4 LACTIC ACID, ED: LACTIC ACID, VENOUS: 0.93 mmol/L (ref 0.5–1.9)

## 2017-06-09 LAB — BRAIN NATRIURETIC PEPTIDE: B NATRIURETIC PEPTIDE 5: 106.3 pg/mL — AB (ref 0.0–100.0)

## 2017-06-09 MED ORDER — SODIUM CHLORIDE 0.9 % IV SOLN
INTRAVENOUS | Status: DC
  Administered 2017-06-09: 20 mL/h via INTRAVENOUS

## 2017-06-09 MED ORDER — SODIUM CHLORIDE 0.9 % IV SOLN
500.0000 mg | Freq: Once | INTRAVENOUS | Status: AC
  Administered 2017-06-09: 500 mg via INTRAVENOUS
  Filled 2017-06-09: qty 500

## 2017-06-09 MED ORDER — AMOXICILLIN-POT CLAVULANATE 875-125 MG PO TABS: 2.0000 | ORAL_TABLET | Freq: Two times a day (BID) | ORAL | 0 refills | Status: AC

## 2017-06-09 MED ORDER — SODIUM CHLORIDE 0.9 % IV BOLUS
1000.0000 mL | Freq: Once | INTRAVENOUS | Status: AC
  Administered 2017-06-09: 1000 mL via INTRAVENOUS

## 2017-06-09 MED ORDER — ALBUTEROL SULFATE (2.5 MG/3ML) 0.083% IN NEBU
5.0000 mg | INHALATION_SOLUTION | Freq: Once | RESPIRATORY_TRACT | Status: AC
  Administered 2017-06-09: 5 mg via RESPIRATORY_TRACT
  Filled 2017-06-09: qty 6

## 2017-06-09 MED ORDER — SODIUM CHLORIDE 0.9 % IV SOLN
1.0000 g | Freq: Once | INTRAVENOUS | Status: AC
  Administered 2017-06-09: 1 g via INTRAVENOUS
  Filled 2017-06-09: qty 10

## 2017-06-09 NOTE — ED Triage Notes (Signed)
Pt states coldlike symptoms for a while, including a productive cough. Pt states she sometimes feels short of breath. Pt was admitted last year and had surgery to remove mucous from lower lungs and is concerned for the same.

## 2017-06-09 NOTE — ED Provider Notes (Signed)
[facility] DEPT Provider Note  CSN: [id number] Arrival date & time: 06/09/17 1115  Chief Complaint(s) Cough  HPI Lisa Mcknight is a 72 y.o. female   The history is provided by the patient.  Cough  This is a new problem. Episode onset: several weeks. Episode frequency: daily; ususally in the morning. The problem has not changed since onset.The cough is productive of sputum. There has been no fever. Associated symptoms include shortness of breath (DOE; mildly worse over the last few weeks). Pertinent negatives include no chest pain, no chills and no myalgias. She has tried nothing for the symptoms. She is not a smoker (quit couple yrs ago). Her past medical history is significant for pneumonia.    Past Medical History Past Medical History:  Diagnosis Date  . Hypertension   . Pneumonia    Patient Active Problem List   Diagnosis Date Noted  . SOB (shortness of breath) 07/07/2016  . Tachycardia 07/07/2016  . Pleural effusion 07/07/2016  . Thyroid mass 07/07/2016  . Acute diastolic congestive heart failure ([place]) 01/20/2011  . Hypertension 01/13/2011  . Acute respiratory failure ([place]) 01/13/2011  . Community acquired bacterial pneumonia 01/13/2011  . Ex-smoker 01/13/2011  . Hyponatremia 01/13/2011   Home Medication(s) Prior to Admission medications   Medication Sig Start Date End Date Taking? Authorizing Provider  Multiple Vitamins-Minerals (MULTIVITAMINS THER. W/MINERALS) TABS Take 1 tablet by mouth daily.     Yes [provider]  amLODipine (NORVASC) 5 MG tablet Take 1 tablet (5 mg total) by mouth daily. Patient not taking: Reported on 06/09/2017 07/10/16   Raiford Noble Latif, DO  amoxicillin-clavulanate (AUGMENTIN) 875-125 MG tablet Take 2 tablets by mouth every 12 (twelve) hours for 7 days. 06/09/17 06/16/17  Fatima Blank, MD  azithromycin (ZITHROMAX) 250 MG tablet Take 2 tablets (500 mg total) by mouth daily. Take for total of 4  more Days. Patient not taking: Reported on 06/09/2017 07/10/16   Raiford Noble Latif, DO  cefpodoxime (VANTIN) 200 MG tablet Take 1 tablet (200 mg total) by mouth 2 (two) times daily. Patient not taking: Reported on 06/09/2017 07/10/16   Raiford Noble Latif, DO  guaiFENesin-dextromethorphan (ROBITUSSIN DM) 100-10 MG/5ML syrup Take 5 mLs by mouth every 6 (six) hours. Patient not taking: Reported on 06/09/2017 07/09/16   Raiford Noble Latif, DO  ondansetron (ZOFRAN) 4 MG tablet Take 1 tablet (4 mg total) by mouth every 6 (six) hours as needed for nausea. Patient not taking: Reported on 06/09/2017 07/09/16   Kerney Elbe, DO                                                                                                                                    Past Surgical History Past Surgical History:  Procedure Laterality Date  . TONSILLECTOMY     Family History Family History  Problem Relation Age of Onset  . Heart disease Father  angina  . Heart disease Brother        Atrial fibrillation    Social History Social History   Tobacco Use  . Smoking status: Former Smoker    Types: Cigarettes  . Smokeless tobacco: Never Used  Substance Use Topics  . Alcohol use: Yes    Comment: Occasional  . Drug use: No   Allergies Patient has no known allergies.  Review of Systems Review of Systems  Constitutional: Negative for chills.  Respiratory: Positive for cough and shortness of breath (DOE; mildly worse over the last few weeks).   Cardiovascular: Negative for chest pain.  Musculoskeletal: Negative for myalgias.   All other systems are reviewed and are negative for acute change except as noted in the HPI  Physical Exam Vital Signs  I have reviewed the triage vital signs BP (!) 185/97 (BP Location: Left Arm)   Pulse (!) 108   Temp 98.1 F (36.7 C) (Oral)   Resp 18   Ht 5\' 5"  (1.651 m)   Wt 54.4 kg (120 lb)   SpO2 95%   BMI 19.97 kg/m   Physical Exam  Constitutional: She  is oriented to person, place, and time. She appears well-developed and well-nourished. No distress.  HENT:  Head: Normocephalic and atraumatic.  Nose: Mucosal edema and rhinorrhea present.  Mouth/Throat: Posterior oropharyngeal erythema (mild with postnasal drip) present. No oropharyngeal exudate or posterior oropharyngeal edema. No tonsillar exudate.  Eyes: Pupils are equal, round, and reactive to light. Conjunctivae and EOM are normal. Right eye exhibits no discharge. Left eye exhibits no discharge. No scleral icterus.  Neck: Normal range of motion. Neck supple.  Cardiovascular: Regular rhythm. Tachycardia present. Exam reveals no gallop and no friction rub.  No murmur heard. Pulmonary/Chest: Effort normal and breath sounds normal. No stridor. No respiratory distress. She has no rales.  Abdominal: Soft. She exhibits no distension. There is no tenderness.  Musculoskeletal: She exhibits no edema or tenderness.  Neurological: She is alert and oriented to person, place, and time.  Skin: Skin is warm and dry. No rash noted. She is not diaphoretic. No erythema.  Psychiatric: She has a normal mood and affect.  Vitals reviewed.   ED Results and Treatments Labs (all labs ordered are listed, but only abnormal results are displayed) Labs Reviewed  CBC WITH DIFFERENTIAL/PLATELET - Abnormal; Notable for the following components:      Result Value   WBC 15.1 (*)    RBC 5.53 (*)    Hemoglobin 17.0 (*)    HCT 50.7 (*)    Neutro Abs 13.0 (*)    Monocytes Absolute 1.3 (*)    All other components within normal limits  BASIC METABOLIC PANEL - Abnormal; Notable for the following components:   Sodium 134 (*)    Chloride 98 (*)    Glucose, Bld 100 (*)    All other components within normal limits  BRAIN NATRIURETIC PEPTIDE - Abnormal; Notable for the following components:   B Natriuretic Peptide 106.3 (*)    All other components within normal limits  I-STAT CG4 LACTIC ACID, ED  EKG  EKG Interpretation  Date/Time:  Saturday Jun 09 2017 12:13:44 EDT Ventricular Rate:  109 PR Interval:    QRS Duration: 98 QT Interval:  339 QTC Calculation: 457 R Axis:   70 Text Interpretation:  Sinus tachycardia Right atrial enlargement Probable inferior infarct, old Baseline wander in lead(s) V5 No significant change since last tracing Confirmed by Addison Lank [phone]) on 06/09/2017 5:42:31 PM      Radiology Dg Chest 2 View  Result Date: 06/09/2017 CLINICAL DATA:  Progressive cough over past week, history pneumonia, fluid in lungs, former smoker, hypertension EXAM: CHEST - 2 VIEW COMPARISON:  07/09/2016 FINDINGS: Upper normal heart size. Mediastinal contours and pulmonary vascularity normal. Atherosclerotic calcifications aorta. Emphysematous changes with bibasilar effusions and atelectasis. Coexistent infiltrate at RIGHT base not excluded. Mild RIGHT upper lobe scarring but upper lobes otherwise clear. No pneumothorax. Bones demineralized. IMPRESSION: COPD changes with bibasilar effusions and atelectasis, cannot exclude coexistent infiltrate in RIGHT lower lobe. Electronically Signed   By: Lavonia Dana M.D.   On: 06/09/2017 13:26   Pertinent labs & imaging results that were available during my care of the patient were reviewed by me and considered in my medical decision making (see chart for details).  Medications Ordered in ED Medications  0.9 %  sodium chloride infusion (20 mL/hr Intravenous New Bag/Given 06/09/17 1625)  albuterol (PROVENTIL) (2.5 MG/3ML) 0.083% nebulizer solution 5 mg (5 mg Nebulization Given 06/09/17 1620)  sodium chloride 0.9 % bolus 1,000 mL (0 mLs Intravenous Stopped 06/09/17 1900)  cefTRIAXone (ROCEPHIN) 1 g in sodium chloride 0.9 % 100 mL IVPB (0 g Intravenous Stopped 06/09/17 1900)  azithromycin (ZITHROMAX) 500 mg in sodium chloride 0.9 % 250 mL IVPB (0 mg  Intravenous Stopped 06/09/17 2053)                                                                                                                                    Procedures Procedures CRITICAL CARE Performed by: Grayce Sessions Garlen Reinig Total critical care time: 45 minutes Critical care time was exclusive of separately billable procedures and treating other patients. Critical care was necessary to treat or prevent imminent or life-threatening deterioration. Critical care was time spent personally by me on the following activities: development of treatment plan with patient and/or surrogate as well as nursing, discussions with consultants, evaluation of patient's response to treatment, examination of patient, obtaining history from patient or surrogate, ordering and performing treatments and interventions, ordering and review of laboratory studies, ordering and review of radiographic studies, pulse oximetry and re-evaluation of patient's condition.  (including critical care time)  Medical Decision Making / ED Course I have reviewed the nursing notes for this encounter and the patient's prior records (if available in EHR or on provided paperwork).    Patient is afebrile, tachycardic.  Lungs clear to auscultation bilaterally.  No respiratory distress.  Patient has a history of community-acquired pneumonia.  Chest x-ray suspicious  for possible pneumonia.  Labs revealed leukocytosis.  Lactic acid within normal limits.  Patient was given IV fluids and empiric antibiotics in the emergency department.  Monitored for several hours.  Tachycardia resolved.  Patient remained hemodynamically stable without respiratory distress.  Discuss options between admission versus discharge home.  Patient felt safe being discharged.  Patient with a curb 65 score of 1 thus I feel that she should be appropriate for outpatient management.  She was given strict return precautions.  The patient appears reasonably screened  and/or stabilized for discharge and I doubt any other medical condition or other [facility] requiring further screening, evaluation, or treatment in the ED at this time prior to discharge.  The patient is safe for discharge with strict return precautions.   Final Clinical Impression(s) / ED Diagnoses Final diagnoses:  Community acquired pneumonia of right lower lobe of lung ([place])   Disposition: Discharge  Condition: Good  I have discussed the results, Dx and Tx plan with the patient who expressed understanding and agree(s) with the plan. Discharge instructions discussed at great length. The patient was given strict return precautions who verbalized understanding of the instructions. No further questions at time of discharge.    ED Discharge Orders        Ordered    amoxicillin-clavulanate (AUGMENTIN) 875-125 MG tablet  Every 12 hours     06/09/17 2051       Follow Up: Primary care provider  Schedule an appointment as soon as possible for a visit  in 5-7 days, If symptoms do not improve or  worsen      This chart was dictated using voice recognition software.  Despite best efforts to proofread,  errors can occur which can change the documentation meaning.   Fatima Blank, MD 06/09/17 [phone]

## 2018-12-31 ENCOUNTER — Other Ambulatory Visit: Payer: Self-pay

## 2018-12-31 DIAGNOSIS — Z1231 Encounter for screening mammogram for malignant neoplasm of breast: Secondary | ICD-10-CM

## 2019-03-14 ENCOUNTER — Ambulatory Visit: Payer: Medicare HMO | Attending: Internal Medicine

## 2019-03-14 DIAGNOSIS — Z23 Encounter for immunization: Secondary | ICD-10-CM

## 2019-03-14 NOTE — Progress Notes (Signed)
   Covid-19 Vaccination Clinic  Name:  Lisa Mcknight    MRN: [id number] DOB: [birth year]  03/14/2019  Lisa Mcknight was observed post Covid-19 immunization for 15 minutes without incidence. She was provided with Vaccine Information Sheet and instruction to access the V-Safe system.   Lisa Mcknight was instructed to call 911 with any severe reactions post vaccine: Marland Kitchen Difficulty breathing  . Swelling of your face and throat  . A fast heartbeat  . A bad rash all over your body  . Dizziness and weakness    Immunizations Administered    Name Date Dose VIS Date Route   Pfizer COVID-19 Vaccine 03/14/2019  9:18 AM 0.3 mL 01/10/2019 Intramuscular   Manufacturer: [place]   Lot: [id number]   [place]: [id number]

## 2019-04-05 ENCOUNTER — Ambulatory Visit: Payer: Medicare HMO | Attending: Internal Medicine

## 2019-04-05 DIAGNOSIS — Z23 Encounter for immunization: Secondary | ICD-10-CM | POA: Insufficient documentation

## 2019-04-05 NOTE — Progress Notes (Signed)
   Covid-19 Vaccination Clinic  Name:  Lisa Mcknight    MRN: [id number] DOB: 01-Aug-1945  04/05/2019  Lisa Mcknight was observed post Covid-19 immunization for 15 minutes without incident. She was provided with Vaccine Information Sheet and instruction to access the V-Safe system.   Lisa Mcknight was instructed to call 911 with any severe reactions post vaccine: Marland Kitchen Difficulty breathing  . Swelling of face and throat  . A fast heartbeat  . A bad rash all over body  . Dizziness and weakness   Immunizations Administered    Name Date Dose VIS Date Route   Pfizer COVID-19 Vaccine 04/05/2019  2:46 PM 0.3 mL 01/10/2019 Intramuscular   Manufacturer: [place]   Lot: [id number]   [place]: [id number]

## 2019-05-14 DIAGNOSIS — R69 Illness, unspecified: Secondary | ICD-10-CM | POA: Diagnosis not present

## 2019-08-13 DIAGNOSIS — R69 Illness, unspecified: Secondary | ICD-10-CM | POA: Diagnosis not present

## 2019-09-16 DIAGNOSIS — Z0100 Encounter for examination of eyes and vision without abnormal findings: Secondary | ICD-10-CM | POA: Diagnosis not present

## 2019-09-16 IMAGING — CR DG CHEST 2V
2 series · 2 of 2 positions shown · non-contrast
Comparison: 07/09/2016

CLINICAL DATA: Progressive cough over past week, history pneumonia,
fluid in lungs, former smoker, hypertension

EXAM:
CHEST - 2 VIEW

[w chest pa]
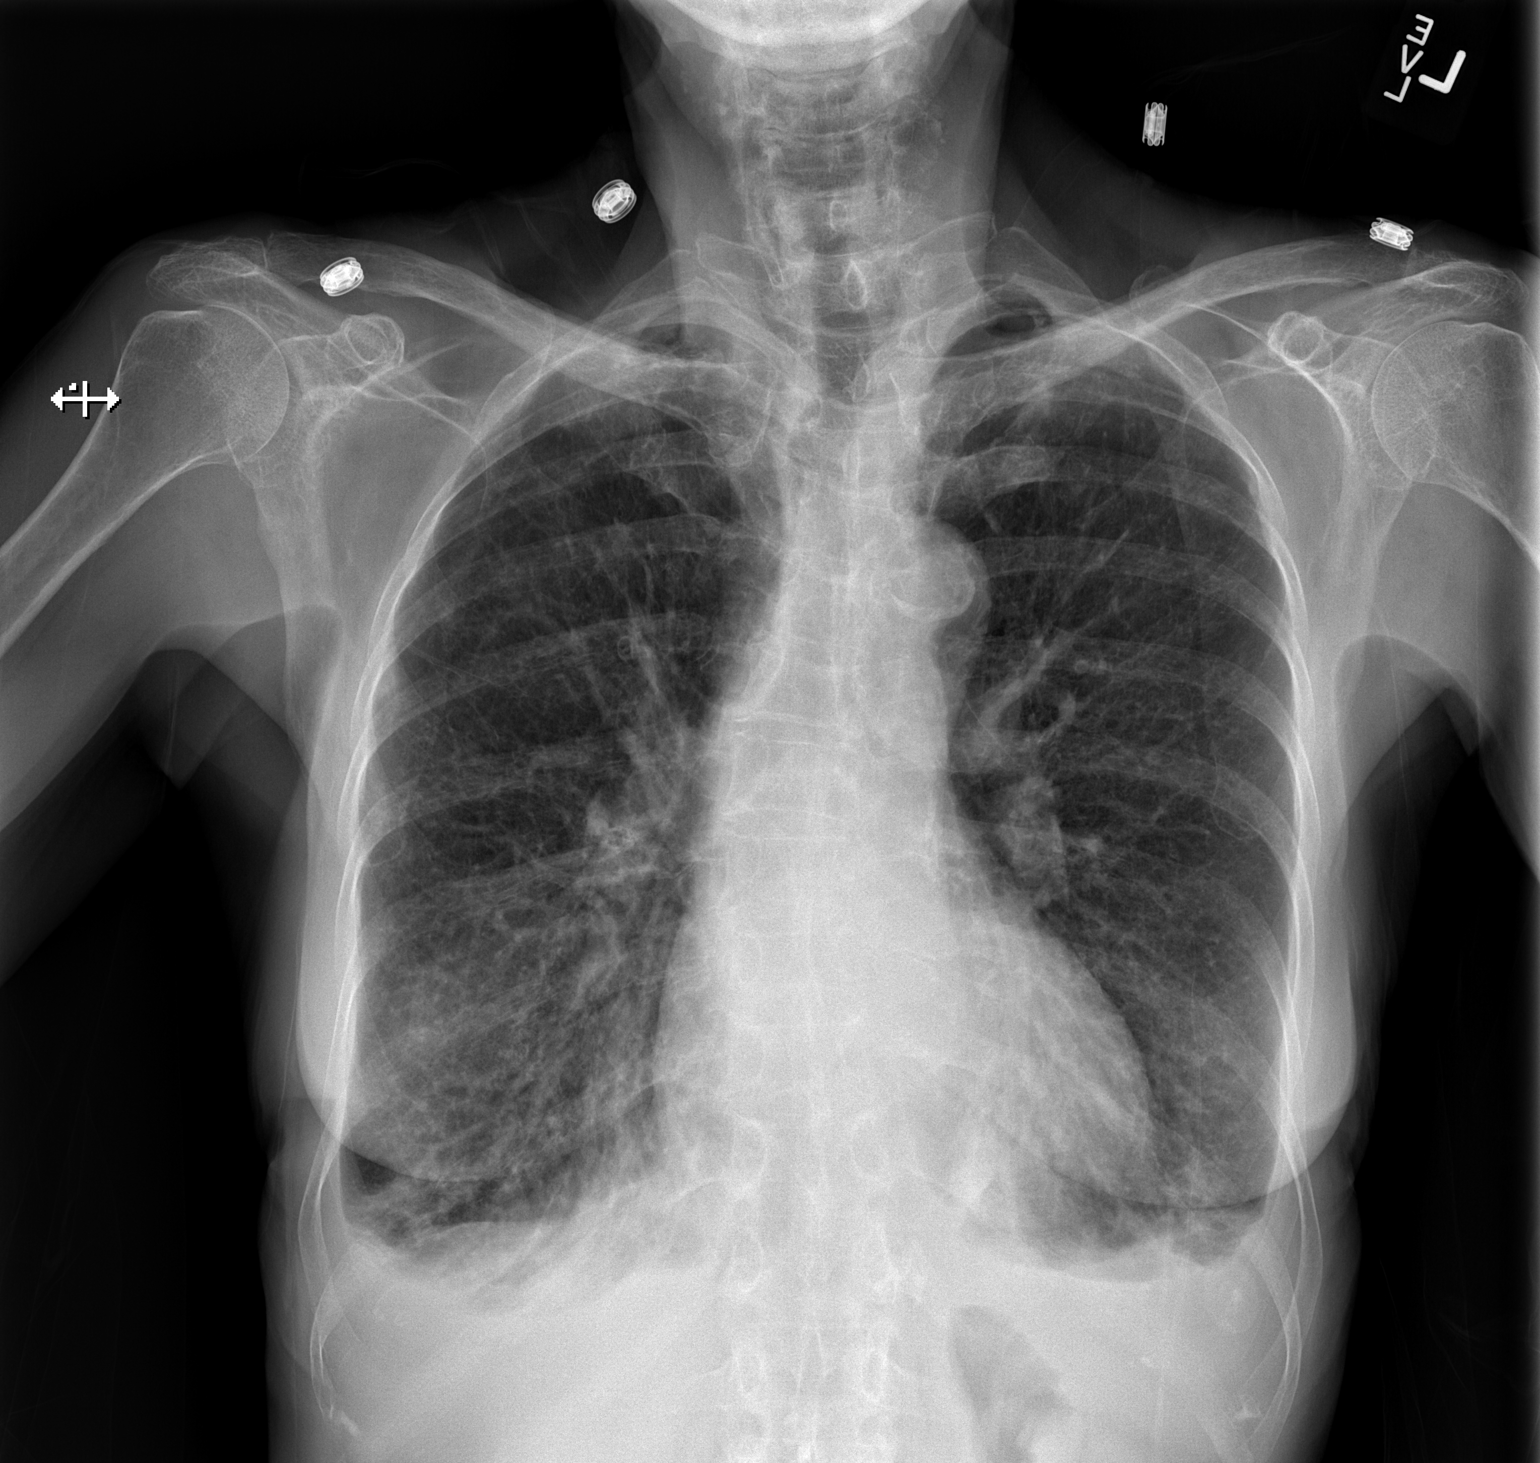

[w chest lat]
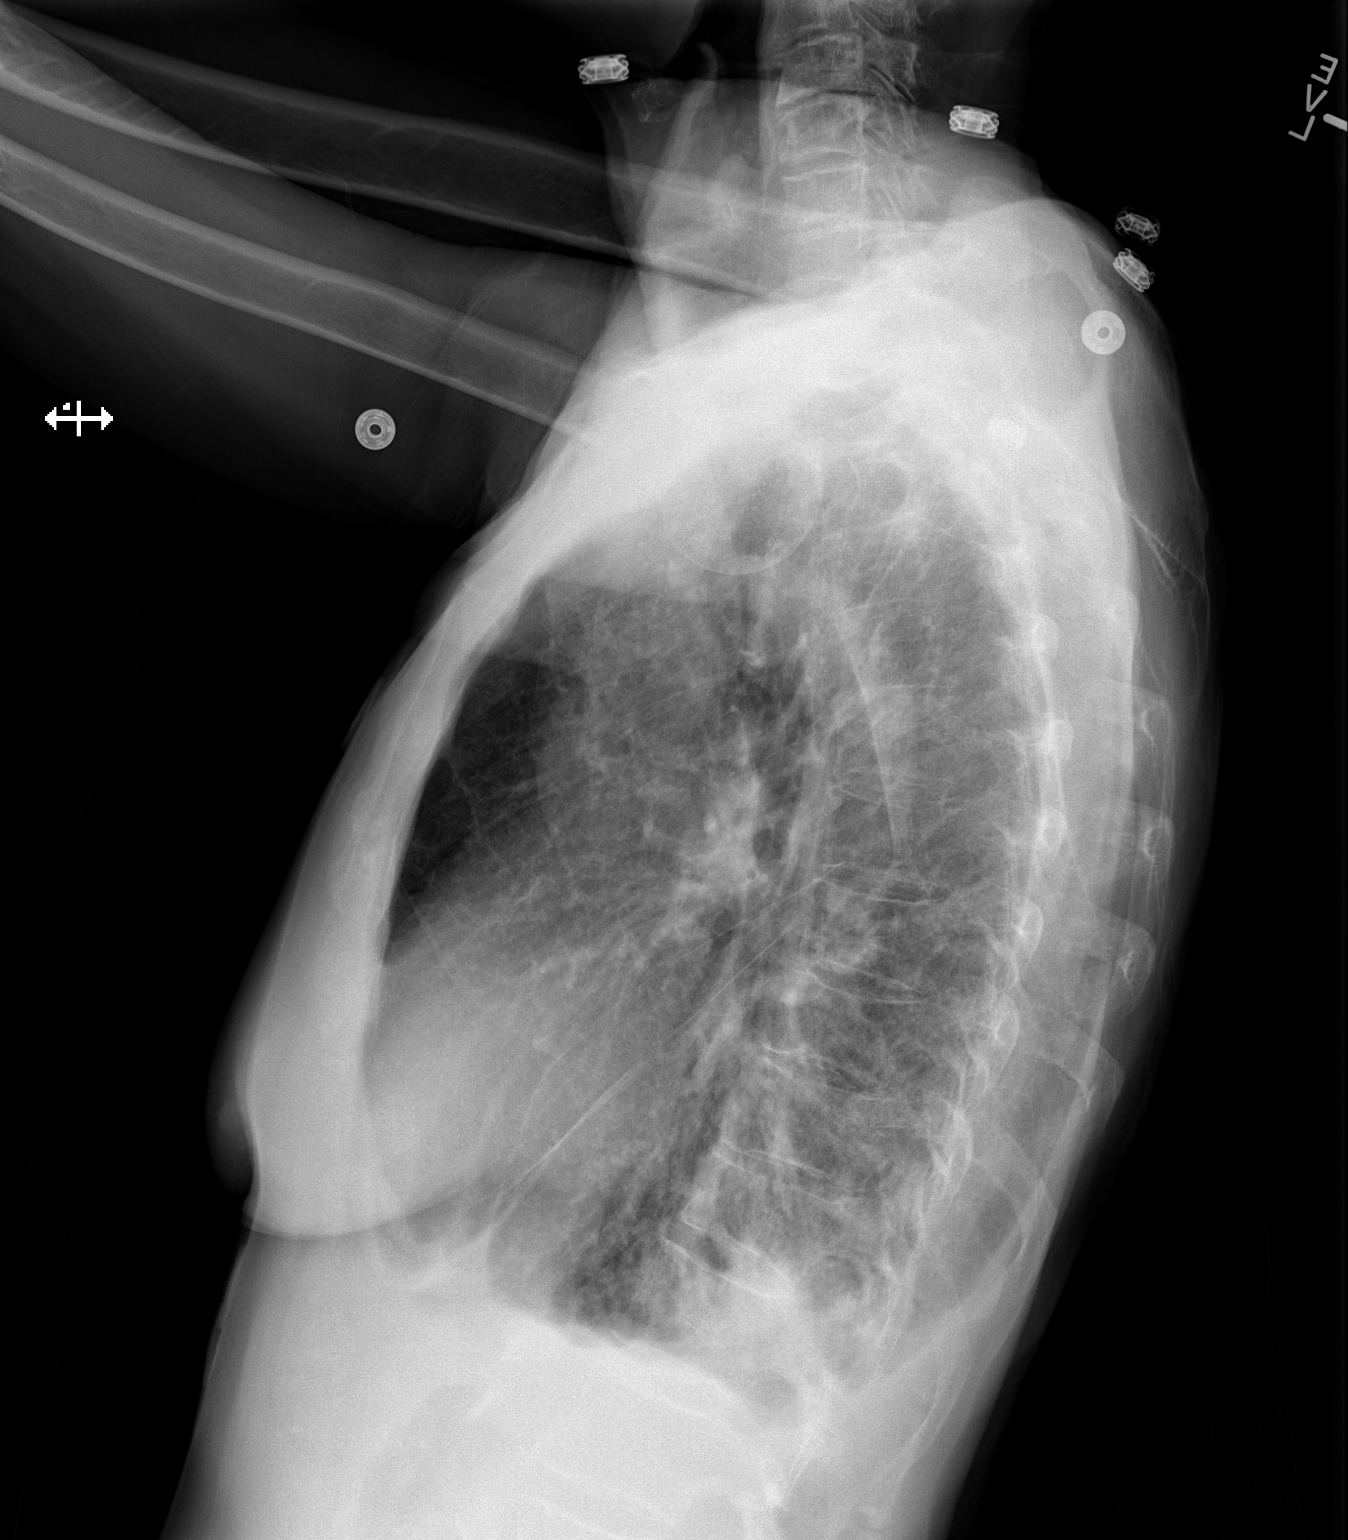

[2 of 2 positions shown; findings below may reference images not displayed]

FINDINGS: Upper normal heart size.

Mediastinal contours and pulmonary vascularity normal.

Atherosclerotic calcifications aorta.

Emphysematous changes with bibasilar effusions and atelectasis.

Coexistent infiltrate at RIGHT base not excluded.

Mild RIGHT upper lobe scarring but upper lobes otherwise clear.

No pneumothorax.

Bones demineralized.
IMPRESSION: COPD changes with bibasilar effusions and atelectasis, cannot
exclude coexistent infiltrate in RIGHT lower lobe.

## 2021-06-14 DIAGNOSIS — R413 Other amnesia: Secondary | ICD-10-CM | POA: Diagnosis not present

## 2021-06-14 DIAGNOSIS — I878 Other specified disorders of veins: Secondary | ICD-10-CM | POA: Diagnosis not present

## 2021-06-14 DIAGNOSIS — R7309 Other abnormal glucose: Secondary | ICD-10-CM | POA: Diagnosis not present

## 2021-06-14 DIAGNOSIS — L603 Nail dystrophy: Secondary | ICD-10-CM | POA: Diagnosis not present

## 2021-06-14 DIAGNOSIS — R609 Edema, unspecified: Secondary | ICD-10-CM | POA: Diagnosis not present

## 2021-06-14 DIAGNOSIS — R0600 Dyspnea, unspecified: Secondary | ICD-10-CM | POA: Diagnosis not present

## 2021-06-14 DIAGNOSIS — R2242 Localized swelling, mass and lump, left lower limb: Secondary | ICD-10-CM | POA: Diagnosis not present

## 2021-06-14 DIAGNOSIS — Z122 Encounter for screening for malignant neoplasm of respiratory organs: Secondary | ICD-10-CM | POA: Diagnosis not present

## 2021-06-14 DIAGNOSIS — L309 Dermatitis, unspecified: Secondary | ICD-10-CM | POA: Diagnosis not present

## 2021-06-14 DIAGNOSIS — L84 Corns and callosities: Secondary | ICD-10-CM | POA: Diagnosis not present

## 2021-06-14 DIAGNOSIS — R634 Abnormal weight loss: Secondary | ICD-10-CM | POA: Diagnosis not present

## 2021-06-14 DIAGNOSIS — E538 Deficiency of other specified B group vitamins: Secondary | ICD-10-CM | POA: Diagnosis not present

## 2021-06-14 DIAGNOSIS — I10 Essential (primary) hypertension: Secondary | ICD-10-CM | POA: Diagnosis not present

## 2021-06-14 DIAGNOSIS — L989 Disorder of the skin and subcutaneous tissue, unspecified: Secondary | ICD-10-CM | POA: Diagnosis not present

## 2021-06-23 DIAGNOSIS — Z1283 Encounter for screening for malignant neoplasm of skin: Secondary | ICD-10-CM | POA: Diagnosis not present

## 2021-06-23 DIAGNOSIS — X32XXXA Exposure to sunlight, initial encounter: Secondary | ICD-10-CM | POA: Diagnosis not present

## 2021-06-23 DIAGNOSIS — C44329 Squamous cell carcinoma of skin of other parts of face: Secondary | ICD-10-CM | POA: Diagnosis not present

## 2021-06-23 DIAGNOSIS — L570 Actinic keratosis: Secondary | ICD-10-CM | POA: Diagnosis not present

## 2021-06-23 DIAGNOSIS — D225 Melanocytic nevi of trunk: Secondary | ICD-10-CM | POA: Diagnosis not present

## 2021-07-06 DIAGNOSIS — R413 Other amnesia: Secondary | ICD-10-CM | POA: Diagnosis not present

## 2021-07-06 DIAGNOSIS — R634 Abnormal weight loss: Secondary | ICD-10-CM | POA: Diagnosis not present

## 2021-07-06 DIAGNOSIS — R2681 Unsteadiness on feet: Secondary | ICD-10-CM | POA: Diagnosis not present

## 2021-07-06 DIAGNOSIS — R0600 Dyspnea, unspecified: Secondary | ICD-10-CM | POA: Diagnosis not present

## 2021-07-06 DIAGNOSIS — I10 Essential (primary) hypertension: Secondary | ICD-10-CM | POA: Diagnosis not present

## 2021-07-06 DIAGNOSIS — R251 Tremor, unspecified: Secondary | ICD-10-CM | POA: Diagnosis not present

## 2021-07-11 ENCOUNTER — Encounter: Payer: Self-pay | Admitting: Psychology

## 2021-07-12 DIAGNOSIS — Z87891 Personal history of nicotine dependence: Secondary | ICD-10-CM | POA: Diagnosis not present

## 2021-08-04 DIAGNOSIS — D0439 Carcinoma in situ of skin of other parts of face: Secondary | ICD-10-CM | POA: Diagnosis not present

## 2021-08-04 DIAGNOSIS — L570 Actinic keratosis: Secondary | ICD-10-CM | POA: Diagnosis not present

## 2021-08-04 DIAGNOSIS — X32XXXD Exposure to sunlight, subsequent encounter: Secondary | ICD-10-CM | POA: Diagnosis not present

## 2021-08-16 ENCOUNTER — Encounter: Payer: Self-pay | Admitting: *Deleted

## 2021-08-16 ENCOUNTER — Other Ambulatory Visit: Payer: Self-pay | Admitting: *Deleted

## 2021-08-17 ENCOUNTER — Ambulatory Visit: Payer: Medicare HMO | Admitting: Psychiatry

## 2021-08-17 ENCOUNTER — Encounter: Payer: Self-pay | Admitting: Psychiatry

## 2021-08-17 VITALS — BP 172/77 | HR 103 | Ht 66.0 in | Wt 96.6 lb

## 2021-08-17 DIAGNOSIS — R413 Other amnesia: Secondary | ICD-10-CM | POA: Diagnosis not present

## 2021-08-17 DIAGNOSIS — R251 Tremor, unspecified: Secondary | ICD-10-CM | POA: Diagnosis not present

## 2021-08-17 NOTE — Patient Instructions (Signed)
Plan: MRI of the brain Neuropsychological testing scheduled for December  Tasks to improve attention/working memory 1. Good sleep hygiene (7-8 hrs of sleep) 2. Learning a new skill (Painting, Carpentry, Pottery, new language, Knitting). 3.Cognitive exercises (keep a daily journal, Puzzles) 4. Physical exercise and training  (30 min/day X 4 days week) 5. Being on Antidepressant if needed 6.Yoga, Meditation, Tai Chi 7. Decrease alcohol intake 8.Have a clear schedule and structure in daily routine

## 2021-08-17 NOTE — Progress Notes (Signed)
[facility]  PATIENT: Lisa Mcknight DOB: January 17, 1946  REFERRING CLINICIAN: Katherina Mires, MD HISTORY FROM: self, son [name] REASON FOR VISIT: memory loss   HISTORICAL  CHIEF COMPLAINT:  Chief Complaint  Patient presents with   Memory Loss    Rm 1 New Pt sonAaron Edelman  [facility] 76    HISTORY OF PRESENT ILLNESS:  The patient presents for evaluation of memory loss which has been present over the past couple of months. She has noticed word finding difficulty and trouble putting sentences together. Son notices that her sentences have become shorter and she will not have as extensive conversations as before. He notes that she forgets conversations and will repeats herself often.  MMSE was 17/30 at her last PCP visit on 07/06/2021. She was recommended to have a brain MRI but this was not scheduled. She has an appointment with neuropsychology scheduled for December.  She has also developed a tremor in the past couple of months. This involves both arms and her head. She notes that her mother also had a tremor. Has not had any issues with walking or balance.  Mother had dementia  TBI:  No past history of TBI Stroke:  no past history of stroke Seizures:  no past history of seizures Sleep: wakes up frequently at night and has trouble falling asleep at night Mood:  patient denies anxiety and depression  Functional status:  Lives by herself currently Cooking: Has forgotten the stove was on. Will forget how to cook some things Cleaning: has noticed some trouble with keeping the house clean. Does not misplace objects Shopping: no issues Driving: drives without issues Bills: pays bills without issues Medications: manages medications without issues Ever left the stove on by accident?: yes Forget how to use items around the house?: no Getting lost going to familiar places?: no Forgetting loved ones names?: no Word finding difficulty? yes  OTHER MEDICAL CONDITIONS:  HTN   REVIEW OF SYSTEMS: Full 14 system review of systems performed and negative with exception of: memory loss  ALLERGIES: No Known Allergies  HOME MEDICATIONS: Outpatient Medications Prior to Visit  Medication Sig Dispense Refill   albuterol (VENTOLIN HFA) 108 (90 Base) MCG/ACT inhaler SMARTSIG:1 Puff(s) Via Inhaler Every 6 Hours PRN     amLODipine (NORVASC) 2.5 MG tablet Take 2.5 mg by mouth daily.     Multiple Vitamins-Minerals (MULTIVITAMINS THER. W/MINERALS) TABS Take 1 tablet by mouth daily.       triamcinolone ointment (KENALOG) 0.5 % Apply topically 2 (two) times daily.     azithromycin (ZITHROMAX) 250 MG tablet Take 2 tablets (500 mg total) by mouth daily. Take for total of 4 more Days. (Patient not taking: Reported on 06/09/2017) 8 each 0   cefpodoxime (VANTIN) 200 MG tablet Take 1 tablet (200 mg total) by mouth 2 (two) times daily. (Patient not taking: Reported on 06/09/2017) 8 tablet 0   guaiFENesin-dextromethorphan (ROBITUSSIN DM) 100-10 MG/5ML syrup Take 5 mLs by mouth every 6 (six) hours. (Patient not taking: Reported on 06/09/2017) 118 mL 0   ondansetron (ZOFRAN) 4 MG tablet Take 1 tablet (4 mg total) by mouth every 6 (six) hours as needed for nausea. (Patient not taking: Reported on 06/09/2017) 10 tablet 0   No facility-administered medications prior to visit.    PAST MEDICAL HISTORY: Past Medical History:  Diagnosis Date   Headache    Hypertension    Memory difficulty    Pneumonia    Tremor    Unsteady gait  PAST SURGICAL HISTORY: Past Surgical History:  Procedure Laterality Date   TONSILLECTOMY      FAMILY HISTORY: Family History  Problem Relation Age of Onset   Heart disease Father        angina   Heart disease Brother        Atrial fibrillation    SOCIAL HISTORY: Social History   Socioeconomic History   Marital status: Widowed    Spouse name: Not on file   Number of children: 3   Years of education: Not on file   Highest education level:  Bachelor's degree (e.g., BA, AB, BS)  Occupational History   Not on file  Tobacco Use   Smoking status: Former    Types: Cigarettes    Quit date: 05/30/2021    Years since quitting: 0.2   Smokeless tobacco: Never  Vaping Use   Vaping Use: Never used  Substance and Sexual Activity   Alcohol use: Not Currently    Comment: Occasional   Drug use: No   Sexual activity: Never  Other Topics Concern   Not on file  Social History Narrative   08/17/21 lives alone, son lives in town   Social Determinants of Health   Financial Resource Strain: Not on file  Food Insecurity: Not on file  Transportation Needs: Not on file  Physical Activity: Not on file  Stress: Not on file  Social Connections: Not on file  Intimate Partner Violence: Not on file     PHYSICAL EXAM  GENERAL EXAM/CONSTITUTIONAL: Vitals:  Vitals:   08/17/21 1011  BP: (!) 172/77  Pulse: (!) 103  Weight: 96 lb 10.1 oz (43.8 kg)  Height: '5\' 6"'$  (1.676 m)   Body mass index is 15.6 kg/m. Wt Readings from Last 3 Encounters:  08/17/21 96 lb 10.1 oz (43.8 kg)  06/09/17 120 lb (54.4 kg)  07/07/16 125 lb (56.7 kg)   NEUROLOGIC: MENTAL STATUS:       08/17/2021   10:17 AM  Montreal Cognitive Assessment   Visuospatial/ Executive (0/5) 1  Naming (0/3) 2  Attention: Read list of digits (0/2) 1  Attention: Read list of letters (0/1) 0  Attention: Serial 7 subtraction starting at 100 (0/3) 2  Language: Repeat phrase (0/2) 0  Language : Fluency (0/1) 0  Abstraction (0/2) 2  Delayed Recall (0/5) 0  Orientation (0/6) 5  Total 13    CRANIAL NERVE:  2nd, 3rd, 4th, 6th - pupils equal and reactive to light, visual fields full to confrontation, extraocular muscles intact, no nystagmus 5th - facial sensation symmetric 7th - facial strength symmetric 8th - hearing intact 9th - palate elevates symmetrically, uvula midline 11th - shoulder shrug symmetric 12th - tongue protrusion midline  MOTOR:  normal bulk and tone,  full strength in the BUE, BLE. Mild cogwheeling rigidity bilaterally  SENSORY:  normal and symmetric to light touch all 4 extremities  COORDINATION:  finger-nose-finger, fine finger and toe movements normal, Mild resting and action tremor present bilateral arms and head  REFLEXES:  deep tendon reflexes present and symmetric  GAIT/STATION:  Normal stride length and arm swing     DIAGNOSTIC DATA (LABS, IMAGING, TESTING) - I reviewed patient records, labs, notes, testing and imaging myself where available.  Lab Results  Component Value Date   WBC 15.1 (H) 06/09/2017   HGB 17.0 (H) 06/09/2017   HCT 50.7 (H) 06/09/2017   MCV 91.7 06/09/2017   PLT 388 06/09/2017      Component Value Date/Time  NA 134 (L) 06/09/2017 1551   K 4.2 06/09/2017 1551   CL 98 (L) 06/09/2017 1551   CO2 23 06/09/2017 1551   GLUCOSE 100 (H) 06/09/2017 1551   BUN 8 06/09/2017 1551   CREATININE 0.49 06/09/2017 1551   CALCIUM 9.6 06/09/2017 1551   PROT 6.7 07/09/2016 0503   ALBUMIN 3.3 (L) 07/09/2016 0503   AST 27 07/09/2016 0503   ALT 33 07/09/2016 0503   ALKPHOS 124 07/09/2016 0503   BILITOT 0.5 07/09/2016 0503   GFRNONAA >60 06/09/2017 1551   GFRAA >60 06/09/2017 1551   No results found for: "CHOL", "HDL", "LDLCALC", "LDLDIRECT", "TRIG", "CHOLHDL" No results found for: "HGBA1C" No results found for: "VITAMINB12" Lab Results  Component Value Date   TSH 0.641 07/07/2016   06/14/21: TSH 1.17, B12 853  ASSESSMENT AND PLAN  77 y.o. year old female with a history of HTN who presents for evaluation of memory loss over the past 2 months. Her MOCA today is 13/30 which is suggestive of dementia. Memory has slightly impacted her ADLs though she is able to function relatively independently. Agree with MRI brain and neuropsychological testing. Discussed starting a memory medication today but she prefers to hold off until after testing. At this point it is not clear whether her tremor represents essential  tremor or early Parkinson's. She does have a resting and action tremor involving both upper extremities and her head. Other than some subtle rigidity in the upper extremities she does not currently exhibit other signs of parkinsonism. Will monitor for now, may consider DAT scan in the future for diagnostic clarity.   1. Memory loss       PLAN: - MRI brain  - Neuropsychologic testing scheduled for December - Discussed limiting driving to short distances, familiar places - Will discuss medication options after testing  Orders Placed This Encounter  Procedures   MR BRAIN W WO CONTRAST    No orders of the defined types were placed in this encounter.   Return in about 6 months (around 02/17/2022).  I spent an average of 39 minutes chart reviewing and counseling the patient, with at least 50% of the time face to face with the patient. General brain health measures discussed, including the importance of regular aerobic exercise. Reviewed safety measures including driving safety.   Genia Harold, MD 08/17/21 11:07 AM  [facility] [address] [place], [place] [address] [phone]

## 2021-08-18 ENCOUNTER — Telehealth: Payer: Self-pay | Admitting: Psychiatry

## 2021-08-18 NOTE — Telephone Encounter (Signed)
[employer] medicare sent to [facility] they obtain auth

## 2021-08-27 ENCOUNTER — Ambulatory Visit
Admission: RE | Admit: 2021-08-27 | Discharge: 2021-08-27 | Disposition: A | Discharge status: Home / Self Care | Payer: Medicare HMO | Source: Ambulatory Visit | Attending: Psychiatry | Admitting: Psychiatry

## 2021-08-27 DIAGNOSIS — R413 Other amnesia: Secondary | ICD-10-CM | POA: Diagnosis not present

## 2021-08-27 MED ORDER — GADOBENATE DIMEGLUMINE 529 MG/ML IV SOLN
8.0000 mL | Freq: Once | INTRAVENOUS | Status: AC | PRN
  Administered 2021-08-27: 8 mL via INTRAVENOUS

## 2021-08-31 DIAGNOSIS — I10 Essential (primary) hypertension: Secondary | ICD-10-CM | POA: Diagnosis not present

## 2021-08-31 DIAGNOSIS — L989 Disorder of the skin and subcutaneous tissue, unspecified: Secondary | ICD-10-CM | POA: Diagnosis not present

## 2021-08-31 DIAGNOSIS — R2242 Localized swelling, mass and lump, left lower limb: Secondary | ICD-10-CM | POA: Diagnosis not present

## 2021-08-31 DIAGNOSIS — R413 Other amnesia: Secondary | ICD-10-CM | POA: Diagnosis not present

## 2021-08-31 DIAGNOSIS — L309 Dermatitis, unspecified: Secondary | ICD-10-CM | POA: Diagnosis not present

## 2021-08-31 DIAGNOSIS — R634 Abnormal weight loss: Secondary | ICD-10-CM | POA: Diagnosis not present

## 2021-09-15 DIAGNOSIS — Z08 Encounter for follow-up examination after completed treatment for malignant neoplasm: Secondary | ICD-10-CM | POA: Diagnosis not present

## 2021-09-15 DIAGNOSIS — Z85828 Personal history of other malignant neoplasm of skin: Secondary | ICD-10-CM | POA: Diagnosis not present

## 2021-09-15 DIAGNOSIS — X32XXXD Exposure to sunlight, subsequent encounter: Secondary | ICD-10-CM | POA: Diagnosis not present

## 2021-09-15 DIAGNOSIS — L570 Actinic keratosis: Secondary | ICD-10-CM | POA: Diagnosis not present

## 2021-10-12 DIAGNOSIS — R911 Solitary pulmonary nodule: Secondary | ICD-10-CM | POA: Diagnosis not present

## 2021-11-07 DIAGNOSIS — R0600 Dyspnea, unspecified: Secondary | ICD-10-CM | POA: Diagnosis not present

## 2021-11-07 DIAGNOSIS — Z23 Encounter for immunization: Secondary | ICD-10-CM | POA: Diagnosis not present

## 2021-11-07 DIAGNOSIS — Z780 Asymptomatic menopausal state: Secondary | ICD-10-CM | POA: Diagnosis not present

## 2021-11-07 DIAGNOSIS — R413 Other amnesia: Secondary | ICD-10-CM | POA: Diagnosis not present

## 2021-11-07 DIAGNOSIS — R911 Solitary pulmonary nodule: Secondary | ICD-10-CM | POA: Diagnosis not present

## 2021-11-07 DIAGNOSIS — Z87891 Personal history of nicotine dependence: Secondary | ICD-10-CM | POA: Diagnosis not present

## 2021-11-07 DIAGNOSIS — R634 Abnormal weight loss: Secondary | ICD-10-CM | POA: Diagnosis not present

## 2021-11-07 DIAGNOSIS — I10 Essential (primary) hypertension: Secondary | ICD-10-CM | POA: Diagnosis not present

## 2021-11-29 DIAGNOSIS — M810 Age-related osteoporosis without current pathological fracture: Secondary | ICD-10-CM | POA: Diagnosis not present

## 2021-12-20 DIAGNOSIS — D472 Monoclonal gammopathy: Secondary | ICD-10-CM | POA: Diagnosis not present

## 2021-12-20 DIAGNOSIS — Z0000 Encounter for general adult medical examination without abnormal findings: Secondary | ICD-10-CM | POA: Diagnosis not present

## 2021-12-20 DIAGNOSIS — M810 Age-related osteoporosis without current pathological fracture: Secondary | ICD-10-CM | POA: Diagnosis not present

## 2021-12-20 DIAGNOSIS — R251 Tremor, unspecified: Secondary | ICD-10-CM | POA: Diagnosis not present

## 2021-12-20 DIAGNOSIS — R413 Other amnesia: Secondary | ICD-10-CM | POA: Diagnosis not present

## 2021-12-20 DIAGNOSIS — R0600 Dyspnea, unspecified: Secondary | ICD-10-CM | POA: Diagnosis not present

## 2021-12-20 DIAGNOSIS — I10 Essential (primary) hypertension: Secondary | ICD-10-CM | POA: Diagnosis not present

## 2021-12-20 DIAGNOSIS — R634 Abnormal weight loss: Secondary | ICD-10-CM | POA: Diagnosis not present

## 2021-12-26 DIAGNOSIS — D225 Melanocytic nevi of trunk: Secondary | ICD-10-CM | POA: Diagnosis not present

## 2021-12-26 DIAGNOSIS — Z1283 Encounter for screening for malignant neoplasm of skin: Secondary | ICD-10-CM | POA: Diagnosis not present

## 2021-12-26 DIAGNOSIS — X32XXXD Exposure to sunlight, subsequent encounter: Secondary | ICD-10-CM | POA: Diagnosis not present

## 2021-12-26 DIAGNOSIS — L570 Actinic keratosis: Secondary | ICD-10-CM | POA: Diagnosis not present

## 2022-01-17 ENCOUNTER — Encounter: Payer: Medicare HMO | Attending: Psychology | Admitting: Psychology

## 2022-01-17 DIAGNOSIS — R69 Illness, unspecified: Secondary | ICD-10-CM | POA: Diagnosis not present

## 2022-01-17 DIAGNOSIS — F801 Expressive language disorder: Secondary | ICD-10-CM | POA: Diagnosis not present

## 2022-01-17 DIAGNOSIS — R413 Other amnesia: Secondary | ICD-10-CM | POA: Insufficient documentation

## 2022-01-19 ENCOUNTER — Encounter (HOSPITAL_BASED_OUTPATIENT_CLINIC_OR_DEPARTMENT_OTHER): Payer: Medicare HMO

## 2022-01-19 DIAGNOSIS — R413 Other amnesia: Secondary | ICD-10-CM | POA: Diagnosis not present

## 2022-01-19 DIAGNOSIS — F801 Expressive language disorder: Secondary | ICD-10-CM | POA: Diagnosis not present

## 2022-01-19 NOTE — Progress Notes (Signed)
   Behavioral Observations The patient appeared well-groomed and appropriately dressed. Her manners were polite and appropriate to the situation. The patient's attitude toward testing was positive and she showed good effort.  Neuropsychology Note  Sherea Liptak completed 60 minutes of neuropsychological testing with technician, Dina Rich, BA, under the supervision of Ilean Skill, PsyD., Clinical Neuropsychologist. The patient did not appear overtly distressed by the testing session, per behavioral observation or via self-report to the technician. Rest breaks were offered.   Clinical Decision Making: In considering the patient's current level of functioning, level of presumed impairment, nature of symptoms, emotional and behavioral responses during clinical interview, level of literacy, and observed level of motivation/effort, a battery of tests was selected by Dr. Sima Matas during initial consultation on 01/17/2022. This was communicated to the technician. Communication between the neuropsychologist and technician was ongoing throughout the testing session and changes were made as deemed necessary based on patient performance on testing, technician observations and additional pertinent factors such as those listed above.  Tests Administered: Controlled Oral Word Association Test Grooved Pegboard Repeatable Battery for the Assessment of Neuropsychological Status (RBANS); Form D  Results:  COWAT FAS total = 7 Z = -2.89 Animals = 3 Z = -3.62      Grooved Pegboard Right hand (dominant) Time = 3:38.79 0 drops 21 rule violations Left hand  Time = 3:13.50 3 drops 3 rule violations      **Results from the RBANS Form D will be included in final report  Feedback to Patient: Anevay Campanella will return on 09/07/2022 or sooner for an interactive feedback session with Dr. Sima Matas at which time her test performances, clinical impressions and treatment recommendations will be  reviewed in detail. The patient understands she can contact our office should she require our assistance before this time.  60 minutes spent face-to-face with patient administering standardized tests, 30 minutes spent scoring Environmental education officer). [CPT [id number], [id number]]  Full report to follow.

## 2022-01-25 DIAGNOSIS — E8809 Other disorders of plasma-protein metabolism, not elsewhere classified: Secondary | ICD-10-CM | POA: Diagnosis not present

## 2022-01-25 DIAGNOSIS — D472 Monoclonal gammopathy: Secondary | ICD-10-CM | POA: Diagnosis not present

## 2022-01-25 DIAGNOSIS — I10 Essential (primary) hypertension: Secondary | ICD-10-CM | POA: Diagnosis not present

## 2022-01-31 DIAGNOSIS — E8809 Other disorders of plasma-protein metabolism, not elsewhere classified: Secondary | ICD-10-CM | POA: Diagnosis not present

## 2022-02-05 NOTE — Progress Notes (Signed)
Neuropsychological Consultation   Patient:   Lisa Mcknight   DOB:   [birth year]  MR Number:  [id number]  Location:  [facility] [place], [place] [id number] [address] [place] [address] Dept: [phone]           Date of Service:   01/17/2022  Start Time:   9 AM End Time:   11 AM  Today's visit was an in person visit that was conducted in my outpatient clinic office with the patient, her son and myself present.  1 hour and 15 minutes was spent in face-to-face clinical interview and the other 45 minutes was spent with records review, report writing and setting up testing protocols.  Provider/Observer:  Ilean Skill, Psy.D.       Clinical Neuropsychologist       Billing Code/Service: 96116/96121  Reason for Service:    Lisa Mcknight is a 77 year old female referred by her primary care provider Suzanna Obey, MD for neuropsychological evaluation due to concerns of cognitive functioning, including changes in speech and expressive language functions, memory issues with well-maintained long-term memory but more difficulty remembering names of family members and very recent events.  Patient has a past medical history including osteoporosis and has used Fosamax as well as hypertension.  Patient has had a significant pneumonia issue with acute respiratory failure in 20 12/2010 and was in the hospital for 10 days.  Issues were first noted approximately 2 or 3 years ago but was very minor in nature and it continued to progress on a steady progressive change pattern.  There have been a number of psychosocial changes including the death of a couple of friends over the past year or 2 and the patient is more isolated due to various reasons over the past couple of years.  The patient has also been seen by her treating neurologist Genia Harold, MD on 08/17/2021 because of the same issues related to  memory loss.  During the visit with her neurologist memory changes were noted over the past couple of months and the patient described noticing word finding difficulties and trouble putting sentences together.  Son notes that patient's sentences have become shorter and she will not have his extensive conversations as before.  He also noted that she tended to forget conversations and will repeat herself often.  Patient has had MMSE of 17/30 and recent PCP visit.  Was also noted that the patient developed a tremor in the past couple of months and was noticed in both arms and her head.  Also reported that her mother had similar tremor.  This is not impacted walking or balance issues.  There was not noted family history of dementia/cognitive change with the patient's mother.  At the time of the neurological evaluation the patient had a MoCA that was 13/30 suggestive of dementia/cognitive loss.  Was also noted that memory has impacted her ADLs though she was still able to function relatively independent.  Dr. Billey Gosling felt it was still early whether her tremors represented essential tremor early Parkinson's but that these tremors did not currently exhibit other signs of parkinsonian type condition.  Patient had MRI brain conducted on 08/27/2021 that showed no acute process or abnormality.  There was mild degree of generalized cerebral atrophy noted but no other abnormalities identified.  During the clinical interview today, the patient's son reports that he for started noticing changes, which were rather minor in nature approximately 2 to  3 years ago and has noticed a slow but steady progression over time.  Both the patient's and her son acknowledged that the patient demonstrates trouble remembering names of others and has issues with effective communication noting word finding difficulties, retrieval/finding words to say and short-term memory difficulties.  The patient is noted to remember long-term/distant information  and is remembering her kids and grandkids names/birthdays but has more trouble remembering them as quickly as she used to.  They both note tremor in her hand shaking over the past couple of months.  The patient is described not having any geographic disorientation or indications of changes in visual spatial.  The patient is still driving short distances but is not going on longer trips.  There have been no motor vehicle accident or difficulties.  The patient is reported to have lost 10 pounds over the past year but reports that she does have an appetite if food is prepared for her.  She is still drinking adequate amount of water.  She also has a boost supplemental drink every day.  The patient's son reports that there is a significant change in motivation and activity/energy levels and that the patient is not doing as much as she did before.  There have been a couple of friends passed away over the past year or 2 and the patient is not walking with her neighbors or having regular communications like she did and is more isolated.  She has 1 adult child living in [place] but there is not a lot of interaction with her family on a day-to-day basis.  Patient reports that she tends to wake up a lot at night but can go back to sleep and will sleep between 10 PM and 7 AM and feels rested in the morning.  Behavioral Observation: Lisa Mcknight  presents as a 77 y.o.-year-old Right handed Caucasian Female who appeared her stated age. her dress was Appropriate and she was Well Groomed and her manners were Appropriate to the situation.  her participation was indicative of Appropriate behaviors.  There were not physical disabilities noted other than mild tremor.  she displayed an appropriate level of cooperation and motivation.    Interactions:    Active Appropriate  Attention:   abnormal and attention span appeared shorter than expected for age  Memory:   abnormal; remote memory intact, recent memory  impaired  Visuo-spatial:  not examined  Speech (Volume):  normal  Speech:   normal; some mild word finding issues noted during the clinical interview  Thought Process:  Coherent and Relevant  Though Content:  WNL; not suicidal and not homicidal  Orientation:   person, place, time/date, and situation  Judgment:   Good  Planning:   Fair  Affect:    Appropriate  Mood:    Euthymic  Insight:   Good  Intelligence:   high  Marital Status/Living: The patient was born in [place] and moved to [place] from [place] until she moved to [place] for college.  Patient moved to the [place] area in 1982.  Patient currently lives alone and manages her ADLs mostly independently including taking baths, surface cleaning and laundry but does have someone come in each month for heavy cleaning.  Patient prepares basic meals.  The patient is widowed and was married for 23 years with no previous marriages.  Patient has 3 adult sons age 37, 51 and 41.  Current Employment: Patient is retired.  Past Employment:  Patient worked as a Radiation protection practitioner prior to retirement.  Substance Use:  No concerns of substance abuse are reported.    Education:   Patient graduated from college.  Medical History:   Past Medical History:  Diagnosis Date   Headache    Hypertension    Memory difficulty    Pneumonia    Tremor    Unsteady gait          Patient Active Problem List   Diagnosis Date Noted   SOB (shortness of breath) 07/07/2016   Tachycardia 07/07/2016   Pleural effusion 07/07/2016   Thyroid mass 99/83/3825   Acute diastolic congestive heart failure ([place]) 01/20/2011   Hypertension 01/13/2011   Acute respiratory failure ([place]) 01/13/2011   Community acquired bacterial pneumonia 01/13/2011   Ex-smoker 01/13/2011   Hyponatremia 01/13/2011    Psychiatric History:  No prior psychiatric history  Family Med/Psych History:  Family History  Problem Relation Age of Onset   Heart disease Father         angina   Heart disease Brother        Atrial fibrillation    Impression/DX:  Lisa Mcknight is a 77 year old female referred by her primary care provider Suzanna Obey, MD for neuropsychological evaluation due to concerns of cognitive functioning, including changes in speech and expressive language functions, memory issues with well-maintained long-term memory but more difficulty remembering names of family members and very recent events.  Patient has a past medical history including osteoporosis and has used Fosamax as well as hypertension.  Patient has had a significant pneumonia issue with acute respiratory failure in 20 12/2010 and was in the hospital for 10 days.  Issues were first noted approximately 2 or 3 years ago but was very minor in nature and it continued to progress on a steady progressive change pattern.  There have been a number of psychosocial changes including the death of a couple of friends over the past year or 2 and the patient is more isolated due to various reasons over the past couple of years.  Disposition/Plan:  We have set the patient up for formal neuropsychological testing and will be administered the repeatable battery for the assessment of neuropsychological status, the controlled oral Word Association test and the grooved pegboard test.  Once these are completed a formal report will be produced and provided to her PCP and her neurologist and there will be an opportunity to sit down with the patient and any family members that the patient agreed to and who wanted to attend either in person or through speaker phone to go over the results and go over treatment recommendations going forward.  Diagnosis:    Memory loss  Expressive language impairment         Electronically Signed   _______________________ Ilean Skill, Psy.D. Clinical Neuropsychologist

## 2022-02-07 DIAGNOSIS — E8809 Other disorders of plasma-protein metabolism, not elsewhere classified: Secondary | ICD-10-CM | POA: Diagnosis not present

## 2022-02-07 DIAGNOSIS — M1612 Unilateral primary osteoarthritis, left hip: Secondary | ICD-10-CM | POA: Diagnosis not present

## 2022-02-07 DIAGNOSIS — M47812 Spondylosis without myelopathy or radiculopathy, cervical region: Secondary | ICD-10-CM | POA: Diagnosis not present

## 2022-02-07 DIAGNOSIS — J439 Emphysema, unspecified: Secondary | ICD-10-CM | POA: Diagnosis not present

## 2022-02-07 DIAGNOSIS — R918 Other nonspecific abnormal finding of lung field: Secondary | ICD-10-CM | POA: Diagnosis not present

## 2022-02-15 DIAGNOSIS — D472 Monoclonal gammopathy: Secondary | ICD-10-CM | POA: Diagnosis not present

## 2022-03-20 ENCOUNTER — Encounter: Payer: Self-pay | Admitting: Psychiatry

## 2022-03-20 ENCOUNTER — Ambulatory Visit: Payer: Medicare HMO | Admitting: Psychiatry

## 2022-03-20 VITALS — BP 131/68 | HR 111 | Ht 66.0 in | Wt 94.2 lb

## 2022-03-20 DIAGNOSIS — R251 Tremor, unspecified: Secondary | ICD-10-CM

## 2022-03-20 DIAGNOSIS — R413 Other amnesia: Secondary | ICD-10-CM | POA: Diagnosis not present

## 2022-03-20 MED ORDER — DONEPEZIL HCL 10 MG PO TABS: 10.0000 mg | ORAL_TABLET | Freq: Every day | ORAL | 3 refills | Status: DC

## 2022-03-20 MED ORDER — DONEPEZIL HCL 5 MG PO TABS: 5.0000 mg | ORAL_TABLET | Freq: Every day | ORAL | 0 refills | Status: DC

## 2022-03-20 MED ORDER — CARBIDOPA-LEVODOPA 25-100 MG PO TABS: ORAL_TABLET | ORAL | 6 refills | Status: DC

## 2022-03-20 MED ORDER — DONEPEZIL HCL 10 MG PO TABS: 10.0000 mg | ORAL_TABLET | Freq: Every day | ORAL | 6 refills | Status: DC

## 2022-03-20 NOTE — Patient Instructions (Addendum)
Plan:  Donepezil We recommended that you take Donepezil (Aricept) 2m tab once a day for the first 4 weeks, then increase the dose to 178monce per day. It is better to take Donepezil with breakfast or lunch. Aricept is well tolerated, although some people may experience nausea. These side effects were usually mild and temporary. If symptoms continue or become severe, you should stop the medication and [place]       Sinemet (generic name: carbidopa-levodopa)  We recommend you start Sinemet for your tremor. Take half a pill twice daily (8 AM and noon) for one week, then half a pill 3 times a day (8 AM, noon, and 4 PM) for one week, then one pill 3 times a day.  Please try to take the medication either one hour before or 2 hours after your meal.      Tasks to improve attention/working memory 1. Good sleep hygiene (7-8 hrs of sleep) 2. Learning a new skill (Painting, Carpentry, Pottery, new language, Knitting). 3.Cognitive exercises (keep a daily journal, Puzzles) 4. Physical exercise and training  (30 min/day X 4 days week) 5. Being on Antidepressant if needed 6.Yoga, Meditation, Tai Chi 7. Decrease alcohol intake 8.Have a clear schedule and structure in daily routine  MIND Diet: The MeWoodacreiet Intervention for Neurodegenerative Delay, or MIND diet, targets the health of the aging brain. Research participants with the highest MIND diet scores had a significantly slower rate of cognitive decline compared with those with the lowest scores. The effects of the MIND diet on cognition showed greater effects than either the Mediterranean or the DASH diet alone.  The healthy items the MIND diet guidelines suggest include:  3+ servings a day of whole grains 1+ servings a day of vegetables (other than green leafy) 6+ servings a week of green leafy vegetables 5+ servings a week of nuts 4+ meals a week of beans 2+ servings a week of berries 2+ meals a week of poultry 1+ meals a  week of fish Mainly olive oil if added fat is used  The unhealthy items, which are higher in saturated and trans fat, include: Less than 5 servings a week of pastries and sweets Less than 4 servings a week of red meat (including beef, pork, lamb, and products made from these meats) Less than one serving a week of cheese and fried foods Less than 1 tablespoon a day of butter/stick margarine

## 2022-03-20 NOTE — Progress Notes (Signed)
   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  At her last visit, brain MRI was ordered.   Interval History: Patient and her son feel her memory has been relatively stable since her last visit. Sometimes has difficulty expressing herself. She mostly responds with "yes" or "no" answers today. Family does not have safety concerns at this time.   She underwent neuropsychological testing in December 2023 and is still awaiting these test results. MRI brain showed mild generalized cerebral atrophy, slightly advanced for her age. It was otherwise unremarkable.  Her tremor seems to be getting worse. She notices it most when she is at rest. Denies balance issues or falls since her last visit.  Physical Exam:   Vital Signs: BP 131/68 (BP Location: Left Arm, Patient Position: Sitting, Cuff Size: Normal)   Pulse (!) 111   Ht 5' 6"$  (1.676 m)   Wt 94 lb 3.2 oz (42.7 kg)   BMI 15.20 kg/m  GENERAL:  well appearing, in no acute distress, alert  SKIN:  Color, texture, turgor normal. No rashes or lesions HEAD:  Normocephalic/atraumatic. RESP: normal respiratory effort MSK:  No gross joint deformities.   NEUROLOGICAL: Mental Status:     03/20/2022    3:15 PM 08/17/2021   10:17 AM  Montreal Cognitive Assessment   Visuospatial/ Executive (0/5) 1 1  Naming (0/3) 2 2  Attention: Read list of digits (0/2) 1 1  Attention: Read list of letters (0/1) 0 0  Attention: Serial 7 subtraction starting at 100 (0/3) 0 2  Language: Repeat phrase (0/2) 1 0  Language : Fluency (0/1) 0 0  Abstraction (0/2) 1 2  Delayed Recall (0/5) 0 0  Orientation (0/6) 4 5  Total 10 13  Adjusted Score (based on education) 11    Cranial Nerves: PERRL, face symmetric, no dysarthria, hearing grossly intact Motor: moves all extremities equally. Resting and postural tremor present bilaterally. Mild  bradykinesia on fine finger movements and toe tapping. Mild cogwheeling LUE>RUE. Gait: decreased stride length, decreased arm swing on left  IMPRESSION: 77 year old female with a history of HTN and MGUS who presents for follow up of memory loss and tremor. The patient never heard from neuropsychology about her testing results from December. Will reach out to their office to try and obtain these records. Her MOCA score today is 11/30, which is slightly worse than her last visit. She would like to try a memory medication. Will start donepezil for her memory. Exam today with cogwheeling and resting tremor which appear more pronounced than her last visit. Will start Sinemet trial for suspected Parkinson's disease.  PLAN: -Start donepezil 5 mg daily x4 weeks, then increase to 10 mg daily -Start Sinemet 25-100: take 1/2 pill BID x1 week, then 1/2 pill TID x1 week, then 1 pill TID -Will attempt to obtain results from neuropsychological testing done in December   Follow-up: 7 months  I spent a total of 41 minutes on the date of the service. Discussed medication side effects, adverse reactions and drug interactions. Written educational materials and patient instructions outlining all of the above were given.  Genia Harold, MD 03/20/22 3:50 PM

## 2022-04-12 DIAGNOSIS — R634 Abnormal weight loss: Secondary | ICD-10-CM | POA: Diagnosis not present

## 2022-04-12 DIAGNOSIS — R911 Solitary pulmonary nodule: Secondary | ICD-10-CM | POA: Diagnosis not present

## 2022-04-12 DIAGNOSIS — R251 Tremor, unspecified: Secondary | ICD-10-CM | POA: Diagnosis not present

## 2022-04-12 DIAGNOSIS — R413 Other amnesia: Secondary | ICD-10-CM | POA: Diagnosis not present

## 2022-04-12 DIAGNOSIS — I10 Essential (primary) hypertension: Secondary | ICD-10-CM | POA: Diagnosis not present

## 2022-05-03 ENCOUNTER — Encounter: Payer: Medicare HMO | Attending: Psychology | Admitting: Psychology

## 2022-05-03 DIAGNOSIS — R251 Tremor, unspecified: Secondary | ICD-10-CM | POA: Diagnosis not present

## 2022-05-03 DIAGNOSIS — F801 Expressive language disorder: Secondary | ICD-10-CM | POA: Diagnosis not present

## 2022-05-03 DIAGNOSIS — F0390 Unspecified dementia without behavioral disturbance: Secondary | ICD-10-CM | POA: Diagnosis not present

## 2022-05-03 DIAGNOSIS — R413 Other amnesia: Secondary | ICD-10-CM | POA: Diagnosis not present

## 2022-05-03 NOTE — Progress Notes (Signed)
Neuropsychological Evaluation   Patient:  Lisa Mcknight   DOB: Feb 23, 1945  MR Number: [id number]  Location: [facility] [place] [facility] [address] [place], [address] [id number] [place] [address] Dept: [phone]  Start: 10 AM End: 11 AM  Provider/Observer:     Hershal Coria PsyD  Chief Complaint:      Chief Complaint  Patient presents with   Memory Loss   Tremors    Expressive language deficits with significant word finding and reduction in his lexical fluency    Reason For Service:      Lisa Mcknight is a 77 year old female referred by her primary care provider [name], MD for neuropsychological evaluation due to concerns of cognitive functioning, including changes in speech and expressive language functions, memory issues with well-maintained long-term memory but more difficulty remembering names of family members and very recent events.  Patient has a past medical history including osteoporosis and has used Fosamax as well as hypertension.  Patient has had a significant pneumonia issue with acute respiratory failure in 20 12/2010 and was in the hospital for 10 days.  Issues were first noted approximately 2 or 3 years ago but was very minor in nature and it continued to progress on a steady progressive change pattern.  There have been a number of psychosocial changes including the death of a couple of friends over the past year or 2 and the patient is more isolated due to various reasons over the past couple of years.   The patient has also been seen by her treating neurologist [name], MD on 08/17/2021 because of the same issues related to memory loss.  During the visit with her neurologist memory changes were noted over the past couple of months and the patient described noticing word finding difficulties and trouble putting sentences together.  Son notes that patient's sentences have  become shorter and she will not have as extensive conversations as before.  Patient also noted that she tended to forget conversations and will repeat herself often.  Patient has had MMSE of 17/30 on recent PCP visit.  Was also noted that the patient developed a tremor in the past couple of months and was noticed in both arms and her head.  Also reported that her mother had similar tremor.  This has not impacted walking or balance issues.  There was noted family history of dementia/cognitive change with the patient's mother.  At the time of the neurological evaluation, the patient had a MoCA that was 13/30 suggestive of dementia/cognitive loss.  Was also noted that memory has impacted her ADLs though she was still able to function relatively independent.  [name] felt it was still early to determine whether her tremors represented essential tremor or early Parkinson's but that these tremors did not currently exhibit other signs of parkinsonian type condition.   Patient had MRI brain conducted on 08/27/2021 that showed no acute process or abnormality.  There was mild degree of generalized cerebral atrophy noted but no other abnormalities identified.   During the clinical interview today, the patient's son reports that he for started noticing changes, which were rather minor in nature approximately 2 to 3 years ago and has noticed a slow but steady progression over time.  Both the patient's and her son acknowledged that the patient demonstrates trouble remembering names of others and has issues with effective communication noting word finding difficulties, retrieval/finding words to say and short-term memory  difficulties.  The patient is noted to remember long-term/distant information and is remembering her kids and grandkids names/birthdays but has more trouble remembering them as quickly as she used to.  They both note tremor in her hand that is increasing shaking over the past couple of months.  The patient is  described not having any geographic disorientation or indications of changes in visual spatial or visual constructional abilities.  The patient is still driving short distances but is not going on longer trips.  There have been no motor vehicle accident or difficulties.  The patient is reported to have lost 10 pounds over the past year but reports that she does have an appetite if food is prepared for her.  She is still drinking adequate amount of water.  She also has a boost supplemental drink every day.   The patient's son reports that there is a significant change in motivation and activity/energy levels and that the patient is not doing as much as she did before.  There have been a couple of friends passed away over the past year or 2 and the patient is not walking with her neighbors or having regular communications like she did and is more isolated.  She has 1 adult child living in [place] but there is not a lot of interaction with her family on a day-to-day basis.   Patient reports that she tends to wake up a lot at night but can go back to sleep and will sleep between 10 PM and 7 AM and feels rested in the morning.  Tests Administered: Controlled Oral Word Association Test Grooved Pegboard Repeatable Battery for the Assessment of Neuropsychological Status (RBANS); Form D  Participation Level:   Active  Participation Quality:  Appropriate      Behavioral Observation:  The patient appeared well-groomed and appropriately dressed. Her manners were polite and appropriate to the situation. The patient's attitude toward testing was positive and she showed good effort. Well Groomed, Alert, and Appropriate.   Test Results:   Initially, an estimation was made as to the patient's premorbid intellectual and cognitive abilities.  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  We will use a conservative estimate of her premorbid intellectual and  cognitive abilities to have been in the high average range relative to a normative population roughly performing 1 standard deviation above baseline normative data.   COWAT FAS total = 7 Z = -2.89 Animals = 3 Z = -3.62     The patient was administered the control or word association test which assesses both lexical fluency and semantic fluency elements.  The patient showed significant profound deficits on both of these expressive language measures performing between 2.89 and 3.62 standard deviations below age, gender and education matched comparison group.  This is a significantly impaired score and consistent with descriptions of increasing difficulties with word finding capacity.  She showed deficits for lexical fluency and semantic targeted naming capacity.     Grooved Pegboard Right hand (dominant) Time = 3:38.79 0 drops 21 rule violations Left hand  Time = 3:13.50 3 drops 3 rule violations  The patient was also administered the grooved pegboard test to look for both fine motor control issues as well as look for potential signs of lateralization.  The patient performed very poorly for both right dominant hand and left nondominant hand with great difficulty inhibiting the impulse to grab the shaped keys with both hands to assist her  self.  She had a number of drops with her nondominant hand and required a significant time to complete this task for both hands.  However, there was no indication of any lateralization of deficits although her right dominant hand showed greater impairments than left dominant hand they were both significantly impaired.  The patient has had noted tremor and neurology exams including cogwheeling and resting tremor noted.  The patient was also administered the repeatable battery for the assessment of neuropsychological status (RBANS-D) to allow for the possibility need for progressive assessment.  The patient has also been administered serial MoCA testing through  neurology and has shown progressive decline in her performance over time having scores on previous testing of 17/30, 13/30 and most recently 11/30 performances on MoCA testing with progressive decline over time.  The patient's performance on the RBANS was generally consistent with findings on previous MoCA testing.  The patient showed significant deficits for global cognitive functioning performing in the borderline range relative to a normative comparison group.  This pattern suggests significant cognitive decline from premorbid intellectual and cognitive capacities.  The patient showed only mild to moderate weaknesses with regard to attentional components including effectively making attaining information in her auditory register and significant slowing for overall information processing speed and focus execute abilities.  The patient also performed in the extremely low range relative to immediate and delayed memory functions.  Borderline/significantly impaired functions were noted for both immediate and delayed memory without significant improvement under acute recall formats.  The patient showed significant difficulty with visual-spatial and visual constructional capacity although some of this was noted due to be affected by her significant tremor and poor fine motor control.  The patient's lexical and semantic verbal fluency were significantly impaired relative to the normative population as well consistent with the patient and family reports of changes in expressive language capacity.  These difficulties were for both lexical and semantic fluency capacity.  Under delayed memory measures the patient showed significant loss of information over a period of delay without significant improvements under acute/recognition format.  Impression/Diagnosis:   The results of the current neuropsychological evaluation do show consistency between available medical records, subjective reports by the patient and her family  as well as objective neuropsychological measures.  The patient is showing significant expressive language changes for both lexical and semantic fluency reduction, increasing executive functioning deficits and significant visual spatial and visual constructional deficits.  While her immediate auditory encoding capacity (maintaining information in her active auditory register) was only mildly impaired the patient displayed significant deficits in her ability to store, organize and retrieve new information.  There were significant weaknesses for visual spatial and visual constructional abilities, focus execute abilities, executive functioning, expressive language functions and significant fine motor control deficits noted.  The patient appears to have been progressing with these difficulties over the past 2 to 3 years with noted tremor.  There are no descriptions of auditory or visual hallucinations noted but there have been some adjustment issues and potentially issues with depressive symptomatology.  Specific diagnostic considerations are still difficult to be determined due to the nature as the patient is showing significant changes in cognition that are greater than her motor tremors that she has displayed.  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  We will likely need follow-up testing/repeat testing to provide a somewhat more definitive explanation.  This does not appear  to be a cerebrovascular condition per se with the patient showing cognitive decline for the most part across the board with particular language and memory changes.  Differential diagnoses does continue to remain Alzheimer's versus Lewy body versus Parkinson's conditions and this does appear to be a progressive neurocognitive disorder.  I will sit down with the patient and her family and go over the results of the  current neuropsychological evaluation with specific recommendations.  The patient has been followed by [name] for neurology workup with Dr. Domenic Moras still considering the possibility of a Parkinson's-like condition and the patient has been prescribed Aricept as well as Sinemet for her tremor/motor changes.  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 Clinical Neuropsychologist

## 2022-06-28 ENCOUNTER — Encounter: Payer: Self-pay | Admitting: Psychology

## 2022-08-09 DIAGNOSIS — D472 Monoclonal gammopathy: Secondary | ICD-10-CM | POA: Diagnosis not present

## 2022-08-31 ENCOUNTER — Ambulatory Visit: Payer: Medicare HMO | Admitting: Psychology

## 2022-09-07 ENCOUNTER — Encounter: Payer: Self-pay | Admitting: Psychology

## 2022-09-07 ENCOUNTER — Encounter: Payer: Medicare HMO | Attending: Psychology | Admitting: Psychology

## 2022-09-07 DIAGNOSIS — R251 Tremor, unspecified: Secondary | ICD-10-CM | POA: Diagnosis not present

## 2022-09-07 DIAGNOSIS — R911 Solitary pulmonary nodule: Secondary | ICD-10-CM | POA: Diagnosis not present

## 2022-09-07 DIAGNOSIS — F0390 Unspecified dementia without behavioral disturbance: Secondary | ICD-10-CM

## 2022-09-07 DIAGNOSIS — F801 Expressive language disorder: Secondary | ICD-10-CM

## 2022-09-07 DIAGNOSIS — R413 Other amnesia: Secondary | ICD-10-CM

## 2022-09-07 NOTE — Progress Notes (Signed)
Neuropsychological Evaluation   Patient:  Lisa Mcknight   DOB: Dec 06, 1945  MR Number: [id number]  Location: [facility] [place] [facility] [address], [address] [place] [address] Dept: [phone]  Start: 9 AM End: 10 AM  Today's visit was an in person visit conducted in my outpatient clinic office with the patient myself and her son.  Provider/Observer:     Hershal Coria PsyD  Chief Complaint:      Chief Complaint  Patient presents with   Memory Loss   Tremors   Other    Expressive language changes    09/07/2022: Today I provided feedback regarding the results of the recent neuropsychological evaluation with probable diagnosis of Alzheimer's versus Lewy body and continuing deterioration.  The patient is plan to return in about 9 months to repeat neuropsychological assessment and will be continuing to work with her neurologist.  The patient's son was present and reports that they are working on having much more consistent care for her as she has continued to live by herself up to this point.  The patient is no longer driving.  Follow-up included the reason for service and summary of the full neuropsychological evaluation that can be found inThe patient's EMR the patient's EMR dated 05/03/2022.  Reason For Service:      Lisa Mcknight is a 77 year old female referred by her primary care provider Lisa Harness, MD for neuropsychological evaluation due to concerns of cognitive functioning, including changes in speech and expressive language functions, memory issues with well-maintained long-term memory but more difficulty remembering names of family members and very recent events.  Patient has a past medical history including osteoporosis and has used Fosamax as well as hypertension.  Patient has had a significant pneumonia issue with acute respiratory failure in 20 12/2010 and was in the hospital for 10  days.  Issues were first noted approximately 2 or 3 years ago but was very minor in nature and it continued to progress on a steady progressive change pattern.  There have been a number of psychosocial changes including the death of a couple of friends over the past year or 2 and the patient is more isolated due to various reasons over the past couple of years.   The patient has also been seen by her treating neurologist [name], MD on 08/17/2021 because of the same issues related to memory loss.  During the visit with her neurologist memory changes were noted over the past couple of months and the patient described noticing word finding difficulties and trouble putting sentences together.  Son notes that patient's sentences have become shorter and she will not have as extensive conversations as before.  Patient also noted that she tended to forget conversations and will repeat herself often.  Patient has had MMSE of 17/30 on recent PCP visit.  Was also noted that the patient developed a tremor in the past couple of months and was noticed in both arms and her head.  Also reported that her mother had similar tremor.  This has not impacted walking or balance issues.  There was noted family history of dementia/cognitive change with the patient's mother.  At the time of the neurological evaluation, the patient had a MoCA that was 13/30 suggestive of dementia/cognitive loss.  Was also noted that memory has impacted her ADLs though she was still able to function relatively independent.  [name] felt it was still early to determine  whether her tremors represented essential tremor or early Parkinson's but that these tremors did not currently exhibit other signs of parkinsonian type condition.   Patient had MRI brain conducted on 08/27/2021 that showed no acute process or abnormality.  There was mild degree of generalized cerebral atrophy noted but no other abnormalities identified.   During the clinical  interview today, the patient's son reports that he for started noticing changes, which were rather minor in nature approximately 2 to 3 years ago and has noticed a slow but steady progression over time.  Both the patient's and her son acknowledged that the patient demonstrates trouble remembering names of others and has issues with effective communication noting word finding difficulties, retrieval/finding words to say and short-term memory difficulties.  The patient is noted to remember long-term/distant information and is remembering her kids and grandkids names/birthdays but has more trouble remembering them as quickly as she used to.  They both note tremor in her hand that is increasing shaking over the past couple of months.  The patient is described not having any geographic disorientation or indications of changes in visual spatial or visual constructional abilities.  The patient is still driving short distances but is not going on longer trips.  There have been no motor vehicle accident or difficulties.  The patient is reported to have lost 10 pounds over the past year but reports that she does have an appetite if food is prepared for her.  She is still drinking adequate amount of water.  She also has a boost supplemental drink every day.   The patient's son reports that there is a significant change in motivation and activity/energy levels and that the patient is not doing as much as she did before.  There have been a couple of friends passed away over the past year or 2 and the patient is not walking with her neighbors or having regular communications like she did and is more isolated.  She has 1 adult child living in [place] but there is not a lot of interaction with her family on a day-to-day basis.   Patient reports that she tends to wake up a lot at night but can go back to sleep and will sleep between 10 PM and 7 AM and feels rested in the morning.   Impression/Diagnosis:   The results of  the current neuropsychological evaluation do show consistency between available medical records, subjective reports by the patient and her family as well as objective neuropsychological measures.  The patient is showing significant expressive language changes for both lexical and semantic fluency reduction, increasing executive functioning deficits and significant visual spatial and visual constructional deficits.  While her immediate auditory encoding capacity (maintaining information in her active auditory register) was only mildly impaired the patient displayed significant deficits in her ability to store, organize and retrieve new information.  There were significant weaknesses for visual spatial and visual constructional abilities, focus execute abilities, executive functioning, expressive language functions and significant fine motor control deficits noted.  The patient appears to have been progressing with these difficulties over the past 2 to 3 years with noted tremor.  There are no descriptions of auditory or visual hallucinations noted but there have been some adjustment issues and potentially issues with depressive symptomatology.  Specific diagnostic considerations are still difficult to be determined due to the nature as the patient is showing significant changes in cognition that are greater than her motor tremors that she has displayed.  The patient has features  most consistent with an Alzheimer's based progressive cortical dementia although Lewy body and Parkinson's cannot be ruled out at this point.  We will likely need follow-up testing/repeat testing to provide a somewhat more definitive explanation.  This does not appear to be a cerebrovascular condition per se with the patient showing cognitive decline for the most part across the board with particular language and memory changes.  Differential diagnoses does continue to remain Alzheimer's versus Lewy body versus Parkinson's conditions and  this does appear to be a progressive neurocognitive disorder.  I will sit down with the patient and her family and go over the results of the current neuropsychological evaluation with specific recommendations.  The patient has been followed by [name] for neurology workup with Dr. Domenic Moras still considering the possibility of a Parkinson's-like condition and the patient has been prescribed Aricept as well as Sinemet for her tremor/motor changes.  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 Clinical Neuropsychologist

## 2022-09-08 DIAGNOSIS — R911 Solitary pulmonary nodule: Secondary | ICD-10-CM | POA: Diagnosis not present

## 2022-09-12 DIAGNOSIS — R634 Abnormal weight loss: Secondary | ICD-10-CM | POA: Diagnosis not present

## 2022-09-12 DIAGNOSIS — I10 Essential (primary) hypertension: Secondary | ICD-10-CM | POA: Diagnosis not present

## 2022-09-12 DIAGNOSIS — R0600 Dyspnea, unspecified: Secondary | ICD-10-CM | POA: Diagnosis not present

## 2022-09-12 DIAGNOSIS — R2681 Unsteadiness on feet: Secondary | ICD-10-CM | POA: Diagnosis not present

## 2022-09-12 DIAGNOSIS — R251 Tremor, unspecified: Secondary | ICD-10-CM | POA: Diagnosis not present

## 2022-09-12 DIAGNOSIS — R918 Other nonspecific abnormal finding of lung field: Secondary | ICD-10-CM | POA: Diagnosis not present

## 2022-09-12 DIAGNOSIS — F0390 Unspecified dementia without behavioral disturbance: Secondary | ICD-10-CM | POA: Diagnosis not present

## 2022-09-12 DIAGNOSIS — R413 Other amnesia: Secondary | ICD-10-CM | POA: Diagnosis not present

## 2022-09-20 DIAGNOSIS — R918 Other nonspecific abnormal finding of lung field: Secondary | ICD-10-CM | POA: Diagnosis not present

## 2022-09-20 DIAGNOSIS — R634 Abnormal weight loss: Secondary | ICD-10-CM | POA: Diagnosis not present

## 2022-09-20 DIAGNOSIS — F0390 Unspecified dementia without behavioral disturbance: Secondary | ICD-10-CM | POA: Diagnosis not present

## 2022-09-20 DIAGNOSIS — I10 Essential (primary) hypertension: Secondary | ICD-10-CM | POA: Diagnosis not present

## 2022-09-20 DIAGNOSIS — M810 Age-related osteoporosis without current pathological fracture: Secondary | ICD-10-CM | POA: Diagnosis not present

## 2022-09-20 DIAGNOSIS — R2681 Unsteadiness on feet: Secondary | ICD-10-CM | POA: Diagnosis not present

## 2022-09-20 DIAGNOSIS — R251 Tremor, unspecified: Secondary | ICD-10-CM | POA: Diagnosis not present

## 2022-09-20 DIAGNOSIS — Z87891 Personal history of nicotine dependence: Secondary | ICD-10-CM | POA: Diagnosis not present

## 2022-09-20 DIAGNOSIS — J449 Chronic obstructive pulmonary disease, unspecified: Secondary | ICD-10-CM | POA: Diagnosis not present

## 2022-09-21 DIAGNOSIS — F0390 Unspecified dementia without behavioral disturbance: Secondary | ICD-10-CM | POA: Diagnosis not present

## 2022-09-21 DIAGNOSIS — M810 Age-related osteoporosis without current pathological fracture: Secondary | ICD-10-CM | POA: Diagnosis not present

## 2022-09-21 DIAGNOSIS — R918 Other nonspecific abnormal finding of lung field: Secondary | ICD-10-CM | POA: Diagnosis not present

## 2022-09-21 DIAGNOSIS — R2681 Unsteadiness on feet: Secondary | ICD-10-CM | POA: Diagnosis not present

## 2022-09-21 DIAGNOSIS — J449 Chronic obstructive pulmonary disease, unspecified: Secondary | ICD-10-CM | POA: Diagnosis not present

## 2022-09-21 DIAGNOSIS — I739 Peripheral vascular disease, unspecified: Secondary | ICD-10-CM | POA: Diagnosis not present

## 2022-09-21 DIAGNOSIS — M79671 Pain in right foot: Secondary | ICD-10-CM | POA: Diagnosis not present

## 2022-09-21 DIAGNOSIS — R251 Tremor, unspecified: Secondary | ICD-10-CM | POA: Diagnosis not present

## 2022-09-21 DIAGNOSIS — R634 Abnormal weight loss: Secondary | ICD-10-CM | POA: Diagnosis not present

## 2022-09-21 DIAGNOSIS — M79672 Pain in left foot: Secondary | ICD-10-CM | POA: Diagnosis not present

## 2022-09-21 DIAGNOSIS — I10 Essential (primary) hypertension: Secondary | ICD-10-CM | POA: Diagnosis not present

## 2022-09-21 DIAGNOSIS — L603 Nail dystrophy: Secondary | ICD-10-CM | POA: Diagnosis not present

## 2022-09-21 DIAGNOSIS — Z87891 Personal history of nicotine dependence: Secondary | ICD-10-CM | POA: Diagnosis not present

## 2022-09-21 DIAGNOSIS — L84 Corns and callosities: Secondary | ICD-10-CM | POA: Diagnosis not present

## 2022-09-25 DIAGNOSIS — N200 Calculus of kidney: Secondary | ICD-10-CM | POA: Diagnosis not present

## 2022-09-25 DIAGNOSIS — R911 Solitary pulmonary nodule: Secondary | ICD-10-CM | POA: Diagnosis not present

## 2022-09-26 DIAGNOSIS — J449 Chronic obstructive pulmonary disease, unspecified: Secondary | ICD-10-CM | POA: Diagnosis not present

## 2022-09-26 DIAGNOSIS — I10 Essential (primary) hypertension: Secondary | ICD-10-CM | POA: Diagnosis not present

## 2022-09-26 DIAGNOSIS — Z87891 Personal history of nicotine dependence: Secondary | ICD-10-CM | POA: Diagnosis not present

## 2022-09-26 DIAGNOSIS — R918 Other nonspecific abnormal finding of lung field: Secondary | ICD-10-CM | POA: Diagnosis not present

## 2022-09-26 DIAGNOSIS — R634 Abnormal weight loss: Secondary | ICD-10-CM | POA: Diagnosis not present

## 2022-09-26 DIAGNOSIS — M810 Age-related osteoporosis without current pathological fracture: Secondary | ICD-10-CM | POA: Diagnosis not present

## 2022-09-26 DIAGNOSIS — R251 Tremor, unspecified: Secondary | ICD-10-CM | POA: Diagnosis not present

## 2022-09-26 DIAGNOSIS — R2681 Unsteadiness on feet: Secondary | ICD-10-CM | POA: Diagnosis not present

## 2022-09-26 DIAGNOSIS — F0390 Unspecified dementia without behavioral disturbance: Secondary | ICD-10-CM | POA: Diagnosis not present

## 2022-09-27 DIAGNOSIS — R918 Other nonspecific abnormal finding of lung field: Secondary | ICD-10-CM | POA: Diagnosis not present

## 2022-09-27 DIAGNOSIS — M810 Age-related osteoporosis without current pathological fracture: Secondary | ICD-10-CM | POA: Diagnosis not present

## 2022-09-27 DIAGNOSIS — Z87891 Personal history of nicotine dependence: Secondary | ICD-10-CM | POA: Diagnosis not present

## 2022-09-27 DIAGNOSIS — R634 Abnormal weight loss: Secondary | ICD-10-CM | POA: Diagnosis not present

## 2022-09-27 DIAGNOSIS — R2681 Unsteadiness on feet: Secondary | ICD-10-CM | POA: Diagnosis not present

## 2022-09-27 DIAGNOSIS — F0390 Unspecified dementia without behavioral disturbance: Secondary | ICD-10-CM | POA: Diagnosis not present

## 2022-09-27 DIAGNOSIS — R251 Tremor, unspecified: Secondary | ICD-10-CM | POA: Diagnosis not present

## 2022-09-27 DIAGNOSIS — J449 Chronic obstructive pulmonary disease, unspecified: Secondary | ICD-10-CM | POA: Diagnosis not present

## 2022-09-27 DIAGNOSIS — I10 Essential (primary) hypertension: Secondary | ICD-10-CM | POA: Diagnosis not present

## 2022-09-28 DIAGNOSIS — M810 Age-related osteoporosis without current pathological fracture: Secondary | ICD-10-CM | POA: Diagnosis not present

## 2022-09-28 DIAGNOSIS — I10 Essential (primary) hypertension: Secondary | ICD-10-CM | POA: Diagnosis not present

## 2022-09-28 DIAGNOSIS — R2681 Unsteadiness on feet: Secondary | ICD-10-CM | POA: Diagnosis not present

## 2022-09-28 DIAGNOSIS — R251 Tremor, unspecified: Secondary | ICD-10-CM | POA: Diagnosis not present

## 2022-09-28 DIAGNOSIS — F0390 Unspecified dementia without behavioral disturbance: Secondary | ICD-10-CM | POA: Diagnosis not present

## 2022-09-28 DIAGNOSIS — R918 Other nonspecific abnormal finding of lung field: Secondary | ICD-10-CM | POA: Diagnosis not present

## 2022-09-28 DIAGNOSIS — R634 Abnormal weight loss: Secondary | ICD-10-CM | POA: Diagnosis not present

## 2022-09-28 DIAGNOSIS — J449 Chronic obstructive pulmonary disease, unspecified: Secondary | ICD-10-CM | POA: Diagnosis not present

## 2022-09-28 DIAGNOSIS — Z87891 Personal history of nicotine dependence: Secondary | ICD-10-CM | POA: Diagnosis not present

## 2022-10-03 DIAGNOSIS — F0390 Unspecified dementia without behavioral disturbance: Secondary | ICD-10-CM | POA: Diagnosis not present

## 2022-10-03 DIAGNOSIS — R251 Tremor, unspecified: Secondary | ICD-10-CM | POA: Diagnosis not present

## 2022-10-03 DIAGNOSIS — M810 Age-related osteoporosis without current pathological fracture: Secondary | ICD-10-CM | POA: Diagnosis not present

## 2022-10-03 DIAGNOSIS — Z2839 Other underimmunization status: Secondary | ICD-10-CM | POA: Diagnosis not present

## 2022-10-03 DIAGNOSIS — J449 Chronic obstructive pulmonary disease, unspecified: Secondary | ICD-10-CM | POA: Diagnosis not present

## 2022-10-03 DIAGNOSIS — R918 Other nonspecific abnormal finding of lung field: Secondary | ICD-10-CM | POA: Diagnosis not present

## 2022-10-03 DIAGNOSIS — I10 Essential (primary) hypertension: Secondary | ICD-10-CM | POA: Diagnosis not present

## 2022-10-03 DIAGNOSIS — R634 Abnormal weight loss: Secondary | ICD-10-CM | POA: Diagnosis not present

## 2022-10-03 DIAGNOSIS — R2681 Unsteadiness on feet: Secondary | ICD-10-CM | POA: Diagnosis not present

## 2022-10-03 DIAGNOSIS — R0609 Other forms of dyspnea: Secondary | ICD-10-CM | POA: Diagnosis not present

## 2022-10-03 DIAGNOSIS — Z87891 Personal history of nicotine dependence: Secondary | ICD-10-CM | POA: Diagnosis not present

## 2022-10-04 DIAGNOSIS — M810 Age-related osteoporosis without current pathological fracture: Secondary | ICD-10-CM | POA: Diagnosis not present

## 2022-10-04 DIAGNOSIS — R634 Abnormal weight loss: Secondary | ICD-10-CM | POA: Diagnosis not present

## 2022-10-04 DIAGNOSIS — J449 Chronic obstructive pulmonary disease, unspecified: Secondary | ICD-10-CM | POA: Diagnosis not present

## 2022-10-04 DIAGNOSIS — I10 Essential (primary) hypertension: Secondary | ICD-10-CM | POA: Diagnosis not present

## 2022-10-04 DIAGNOSIS — R251 Tremor, unspecified: Secondary | ICD-10-CM | POA: Diagnosis not present

## 2022-10-04 DIAGNOSIS — Z87891 Personal history of nicotine dependence: Secondary | ICD-10-CM | POA: Diagnosis not present

## 2022-10-04 DIAGNOSIS — R918 Other nonspecific abnormal finding of lung field: Secondary | ICD-10-CM | POA: Diagnosis not present

## 2022-10-04 DIAGNOSIS — R2681 Unsteadiness on feet: Secondary | ICD-10-CM | POA: Diagnosis not present

## 2022-10-04 DIAGNOSIS — F0390 Unspecified dementia without behavioral disturbance: Secondary | ICD-10-CM | POA: Diagnosis not present

## 2022-10-05 DIAGNOSIS — J449 Chronic obstructive pulmonary disease, unspecified: Secondary | ICD-10-CM | POA: Diagnosis not present

## 2022-10-05 DIAGNOSIS — R918 Other nonspecific abnormal finding of lung field: Secondary | ICD-10-CM | POA: Diagnosis not present

## 2022-10-05 DIAGNOSIS — R2681 Unsteadiness on feet: Secondary | ICD-10-CM | POA: Diagnosis not present

## 2022-10-05 DIAGNOSIS — M810 Age-related osteoporosis without current pathological fracture: Secondary | ICD-10-CM | POA: Diagnosis not present

## 2022-10-05 DIAGNOSIS — Z87891 Personal history of nicotine dependence: Secondary | ICD-10-CM | POA: Diagnosis not present

## 2022-10-05 DIAGNOSIS — R251 Tremor, unspecified: Secondary | ICD-10-CM | POA: Diagnosis not present

## 2022-10-05 DIAGNOSIS — I10 Essential (primary) hypertension: Secondary | ICD-10-CM | POA: Diagnosis not present

## 2022-10-05 DIAGNOSIS — R634 Abnormal weight loss: Secondary | ICD-10-CM | POA: Diagnosis not present

## 2022-10-05 DIAGNOSIS — F0390 Unspecified dementia without behavioral disturbance: Secondary | ICD-10-CM | POA: Diagnosis not present

## 2022-10-11 DIAGNOSIS — R634 Abnormal weight loss: Secondary | ICD-10-CM | POA: Diagnosis not present

## 2022-10-11 DIAGNOSIS — J449 Chronic obstructive pulmonary disease, unspecified: Secondary | ICD-10-CM | POA: Diagnosis not present

## 2022-10-11 DIAGNOSIS — Z87891 Personal history of nicotine dependence: Secondary | ICD-10-CM | POA: Diagnosis not present

## 2022-10-11 DIAGNOSIS — R2681 Unsteadiness on feet: Secondary | ICD-10-CM | POA: Diagnosis not present

## 2022-10-11 DIAGNOSIS — R918 Other nonspecific abnormal finding of lung field: Secondary | ICD-10-CM | POA: Diagnosis not present

## 2022-10-11 DIAGNOSIS — R251 Tremor, unspecified: Secondary | ICD-10-CM | POA: Diagnosis not present

## 2022-10-11 DIAGNOSIS — F0390 Unspecified dementia without behavioral disturbance: Secondary | ICD-10-CM | POA: Diagnosis not present

## 2022-10-11 DIAGNOSIS — M810 Age-related osteoporosis without current pathological fracture: Secondary | ICD-10-CM | POA: Diagnosis not present

## 2022-10-11 DIAGNOSIS — I10 Essential (primary) hypertension: Secondary | ICD-10-CM | POA: Diagnosis not present

## 2022-10-12 DIAGNOSIS — J449 Chronic obstructive pulmonary disease, unspecified: Secondary | ICD-10-CM | POA: Diagnosis not present

## 2022-10-12 DIAGNOSIS — M810 Age-related osteoporosis without current pathological fracture: Secondary | ICD-10-CM | POA: Diagnosis not present

## 2022-10-12 DIAGNOSIS — R918 Other nonspecific abnormal finding of lung field: Secondary | ICD-10-CM | POA: Diagnosis not present

## 2022-10-12 DIAGNOSIS — Z87891 Personal history of nicotine dependence: Secondary | ICD-10-CM | POA: Diagnosis not present

## 2022-10-12 DIAGNOSIS — I10 Essential (primary) hypertension: Secondary | ICD-10-CM | POA: Diagnosis not present

## 2022-10-12 DIAGNOSIS — R634 Abnormal weight loss: Secondary | ICD-10-CM | POA: Diagnosis not present

## 2022-10-12 DIAGNOSIS — F0390 Unspecified dementia without behavioral disturbance: Secondary | ICD-10-CM | POA: Diagnosis not present

## 2022-10-12 DIAGNOSIS — R251 Tremor, unspecified: Secondary | ICD-10-CM | POA: Diagnosis not present

## 2022-10-12 DIAGNOSIS — R2681 Unsteadiness on feet: Secondary | ICD-10-CM | POA: Diagnosis not present

## 2022-10-13 DIAGNOSIS — J449 Chronic obstructive pulmonary disease, unspecified: Secondary | ICD-10-CM | POA: Diagnosis not present

## 2022-10-13 DIAGNOSIS — Z87891 Personal history of nicotine dependence: Secondary | ICD-10-CM | POA: Diagnosis not present

## 2022-10-13 DIAGNOSIS — R918 Other nonspecific abnormal finding of lung field: Secondary | ICD-10-CM | POA: Diagnosis not present

## 2022-10-13 DIAGNOSIS — I10 Essential (primary) hypertension: Secondary | ICD-10-CM | POA: Diagnosis not present

## 2022-10-13 DIAGNOSIS — R634 Abnormal weight loss: Secondary | ICD-10-CM | POA: Diagnosis not present

## 2022-10-13 DIAGNOSIS — M810 Age-related osteoporosis without current pathological fracture: Secondary | ICD-10-CM | POA: Diagnosis not present

## 2022-10-13 DIAGNOSIS — R251 Tremor, unspecified: Secondary | ICD-10-CM | POA: Diagnosis not present

## 2022-10-13 DIAGNOSIS — F0390 Unspecified dementia without behavioral disturbance: Secondary | ICD-10-CM | POA: Diagnosis not present

## 2022-10-13 DIAGNOSIS — R2681 Unsteadiness on feet: Secondary | ICD-10-CM | POA: Diagnosis not present

## 2022-10-17 DIAGNOSIS — I10 Essential (primary) hypertension: Secondary | ICD-10-CM | POA: Diagnosis not present

## 2022-10-17 DIAGNOSIS — R2681 Unsteadiness on feet: Secondary | ICD-10-CM | POA: Diagnosis not present

## 2022-10-17 DIAGNOSIS — J449 Chronic obstructive pulmonary disease, unspecified: Secondary | ICD-10-CM | POA: Diagnosis not present

## 2022-10-17 DIAGNOSIS — R634 Abnormal weight loss: Secondary | ICD-10-CM | POA: Diagnosis not present

## 2022-10-17 DIAGNOSIS — Z87891 Personal history of nicotine dependence: Secondary | ICD-10-CM | POA: Diagnosis not present

## 2022-10-17 DIAGNOSIS — M810 Age-related osteoporosis without current pathological fracture: Secondary | ICD-10-CM | POA: Diagnosis not present

## 2022-10-17 DIAGNOSIS — R251 Tremor, unspecified: Secondary | ICD-10-CM | POA: Diagnosis not present

## 2022-10-17 DIAGNOSIS — F0390 Unspecified dementia without behavioral disturbance: Secondary | ICD-10-CM | POA: Diagnosis not present

## 2022-10-17 DIAGNOSIS — R918 Other nonspecific abnormal finding of lung field: Secondary | ICD-10-CM | POA: Diagnosis not present

## 2022-10-18 DIAGNOSIS — R0609 Other forms of dyspnea: Secondary | ICD-10-CM | POA: Diagnosis not present

## 2022-10-19 DIAGNOSIS — R918 Other nonspecific abnormal finding of lung field: Secondary | ICD-10-CM | POA: Diagnosis not present

## 2022-10-19 DIAGNOSIS — R634 Abnormal weight loss: Secondary | ICD-10-CM | POA: Diagnosis not present

## 2022-10-19 DIAGNOSIS — Z87891 Personal history of nicotine dependence: Secondary | ICD-10-CM | POA: Diagnosis not present

## 2022-10-19 DIAGNOSIS — F0390 Unspecified dementia without behavioral disturbance: Secondary | ICD-10-CM | POA: Diagnosis not present

## 2022-10-19 DIAGNOSIS — R251 Tremor, unspecified: Secondary | ICD-10-CM | POA: Diagnosis not present

## 2022-10-19 DIAGNOSIS — J449 Chronic obstructive pulmonary disease, unspecified: Secondary | ICD-10-CM | POA: Diagnosis not present

## 2022-10-19 DIAGNOSIS — I10 Essential (primary) hypertension: Secondary | ICD-10-CM | POA: Diagnosis not present

## 2022-10-19 DIAGNOSIS — M810 Age-related osteoporosis without current pathological fracture: Secondary | ICD-10-CM | POA: Diagnosis not present

## 2022-10-19 DIAGNOSIS — R2681 Unsteadiness on feet: Secondary | ICD-10-CM | POA: Diagnosis not present

## 2022-10-24 DIAGNOSIS — R918 Other nonspecific abnormal finding of lung field: Secondary | ICD-10-CM | POA: Diagnosis not present

## 2022-10-24 DIAGNOSIS — R6521 Severe sepsis with septic shock: Secondary | ICD-10-CM | POA: Diagnosis not present

## 2022-10-24 DIAGNOSIS — K6812 Psoas muscle abscess: Secondary | ICD-10-CM | POA: Diagnosis not present

## 2022-10-24 DIAGNOSIS — J189 Pneumonia, unspecified organism: Secondary | ICD-10-CM | POA: Diagnosis not present

## 2022-10-26 DIAGNOSIS — J449 Chronic obstructive pulmonary disease, unspecified: Secondary | ICD-10-CM | POA: Diagnosis not present

## 2022-10-26 DIAGNOSIS — I10 Essential (primary) hypertension: Secondary | ICD-10-CM | POA: Diagnosis not present

## 2022-10-26 DIAGNOSIS — R634 Abnormal weight loss: Secondary | ICD-10-CM | POA: Diagnosis not present

## 2022-10-26 DIAGNOSIS — R2681 Unsteadiness on feet: Secondary | ICD-10-CM | POA: Diagnosis not present

## 2022-10-26 DIAGNOSIS — Z87891 Personal history of nicotine dependence: Secondary | ICD-10-CM | POA: Diagnosis not present

## 2022-10-26 DIAGNOSIS — F0390 Unspecified dementia without behavioral disturbance: Secondary | ICD-10-CM | POA: Diagnosis not present

## 2022-10-26 DIAGNOSIS — R251 Tremor, unspecified: Secondary | ICD-10-CM | POA: Diagnosis not present

## 2022-10-26 DIAGNOSIS — M810 Age-related osteoporosis without current pathological fracture: Secondary | ICD-10-CM | POA: Diagnosis not present

## 2022-10-26 DIAGNOSIS — R918 Other nonspecific abnormal finding of lung field: Secondary | ICD-10-CM | POA: Diagnosis not present

## 2022-10-27 DIAGNOSIS — R2681 Unsteadiness on feet: Secondary | ICD-10-CM | POA: Diagnosis not present

## 2022-10-27 DIAGNOSIS — F0390 Unspecified dementia without behavioral disturbance: Secondary | ICD-10-CM | POA: Diagnosis not present

## 2022-10-27 DIAGNOSIS — Z87891 Personal history of nicotine dependence: Secondary | ICD-10-CM | POA: Diagnosis not present

## 2022-10-27 DIAGNOSIS — J449 Chronic obstructive pulmonary disease, unspecified: Secondary | ICD-10-CM | POA: Diagnosis not present

## 2022-10-27 DIAGNOSIS — R918 Other nonspecific abnormal finding of lung field: Secondary | ICD-10-CM | POA: Diagnosis not present

## 2022-10-27 DIAGNOSIS — R634 Abnormal weight loss: Secondary | ICD-10-CM | POA: Diagnosis not present

## 2022-10-27 DIAGNOSIS — R251 Tremor, unspecified: Secondary | ICD-10-CM | POA: Diagnosis not present

## 2022-10-27 DIAGNOSIS — I10 Essential (primary) hypertension: Secondary | ICD-10-CM | POA: Diagnosis not present

## 2022-10-27 DIAGNOSIS — M810 Age-related osteoporosis without current pathological fracture: Secondary | ICD-10-CM | POA: Diagnosis not present

## 2022-10-30 ENCOUNTER — Ambulatory Visit: Payer: Medicare HMO | Admitting: Psychiatry

## 2022-10-30 ENCOUNTER — Encounter: Payer: Self-pay | Admitting: Psychiatry

## 2022-10-30 VITALS — BP 147/70 | HR 91 | Ht 65.0 in | Wt 91.6 lb

## 2022-10-30 DIAGNOSIS — G20C Parkinsonism, unspecified: Secondary | ICD-10-CM

## 2022-10-30 DIAGNOSIS — G309 Alzheimer's disease, unspecified: Secondary | ICD-10-CM

## 2022-10-30 DIAGNOSIS — F0280 Dementia in other diseases classified elsewhere without behavioral disturbance: Secondary | ICD-10-CM

## 2022-10-30 DIAGNOSIS — C342 Malignant neoplasm of middle lobe, bronchus or lung: Secondary | ICD-10-CM | POA: Diagnosis not present

## 2022-10-30 MED ORDER — CARBIDOPA-LEVODOPA 25-100 MG PO TABS: ORAL_TABLET | ORAL | 6 refills | Status: DC

## 2022-10-30 MED ORDER — DONEPEZIL HCL 10 MG PO TABS: 10.0000 mg | ORAL_TABLET | Freq: Every day | ORAL | 6 refills | Status: DC

## 2022-10-30 MED ORDER — MEMANTINE HCL 5 MG PO TABS: ORAL_TABLET | ORAL | 6 refills | Status: DC

## 2022-10-30 NOTE — Patient Instructions (Addendum)
[facility] support program link: [email or web address]   DEMENTIA OVERVIEW "Dementia" is a general term for when a person has developed difficulties with reasoning, judgment, and memory. People who have dementia usually have some memory loss as well as difficulty in at least one other area, such as: ?Speaking or writing coherently (or understanding what is said or written) ?Recognizing familiar surroundings ?Planning and carrying out complex or multi-step tasks In order to be considered dementia these issues must be severe enough to interfere with a person's independence and daily activities. Dementia can be caused by several diseases that affect the brain. The most common cause is Alzheimer disease. Alzheimer disease is present in approximately 60 to 80 percent of all cases of dementia; other degenerative and/or vascular diseases may be present as well, particularly as a person gets older.   DEMENTIA RISK FACTORS There is no way to predict with certainty who will develop dementia. Each form of dementia has its own risk factors, but most forms have several risk factors in common. Age -- The biggest risk factor for dementia is age: dementia is rare in people younger than 60 years and becomes very common in people older than 62. For example, dementia affects approximately one in six people between 9 and 41 years old, one in three above 85 years, and almost half of people over age 64.  Family history -- Some forms of dementia have a genetic component, meaning that they tend to run in families. Having a close family member with Alzheimer disease increases your chances of developing it. People with a first-degree relative (parent or sibling) with Alzheimer disease have a greater chance of developing the disorder. The risk is probably highest if the family member developed Alzheimer disease at a younger age (less than 71 years old) and is lower if the family member did not get Alzheimer disease  until late in life. However, families that have a very strong genetic tendency toward Alzheimer disease are uncommon.  Other factors -- Studies indicate that high blood pressure, smoking, and diabetes may be risk factors for dementia. Experts are still not sure how treatment for these problems might influence your risk of developing dementia beyond their benefit of reducing stroke risk. Lifestyle factors have also been implicated in dementia. For instance, people who remain physically active, socially connected, and mentally engaged seem less likely to develop dementia than people who do not. These activities may produce more cognitive (mental) reserve or resilience, delaying the emergence of symptoms until an older age.  DEMENTIA SYMPTOMS Each form of dementia can cause difficulty with memory, language, reasoning, and judgment, but the symptoms are often very different from person to person. Symptoms also change over time. The differences between one form of dementia and another may only be recognizable to skilled health care providers who have experience working with people with dementia. Sometimes family members notice changes but mistakenly attribute them to aging.  Is memory loss normal? -- Many people worry that memory problems are related to early Alzheimer disease. However, some problems are normal and just related to aging, and do not signify a progressive dementia. Normal age-related changes often cause minor difficulties with immediate memory, for example, remembering a phone number or a set of directions for a short time. Temporary difficulty recalling proper names, even very familiar ones, is also common with aging. As people age normally, it is common to complain of less efficient and slower processing and learning of new information. Memory changes due to normal aging are  usually mild and do not worsen greatly over time, nor should they interfere with a person's day-to-day  functioning.  Early changes -- The earliest symptoms of Alzheimer disease are gradual and often subtle. Many people and their families first notice difficulty recalling recent events or information. This often emerges as a tendency to repeat stories or questions or to request or require repetition of material to be able to remember. If you find yourself telling an older family member or friend "I told you that earlier" or "You have told me that more than once," you might begin to suspect Alzheimer disease. Other changes can include one or more of the following: ?Difficulties with language (eg, not being able to find the right words for things) ?Difficulty with concentration and reasoning ?Problems with complex tasks like paying bills, cooking, or balancing a checkbook ?Getting lost in a familiar place  Late changes -- As Alzheimer disease progresses, a person's ability to think clearly continues to decline, and any or all of the changes listed above may be more disruptive. In addition, personality and behavioral symptoms can become quite troublesome. These can include: ?Increased anger or hostility, sometimes aggressive behavior; alternatively, some people become depressed or exhibit little interest in their surroundings (called "apathy") ?Sleep problems ?Hallucinations and/or delusions ?Disorientation ?Needing help with basic tasks (such as eating, bathing, and dressing) ?Incontinence (difficulty controlling the bladder and/or bowels)  The number of symptoms, the functions that are impaired, and the speed with which symptoms progress can vary widely from one person to the next. In some people, severe dementia occurs within five years of the diagnosis; for others, the progression can take more than 10 years. Most people with Alzheimer disease do not die from the disease itself, but rather from a secondary illness such as pneumonia, bladder infection, or complications of a fall.  DEMENTIA  DIAGNOSIS To diagnose dementia and identify the type of dementia, health care providers typically rely on the information they can gather by interacting with the person and speaking with their family members. The provider will typically perform memory and other cognitive (thinking) tests to assess the person's degree of difficulty with different types of problems. The results of these tests can then be monitored over time to observe whether functioning stays the same or declines.  Blood tests are usually done to find out if a chemical or hormonal imbalance or vitamin deficiency is contributing to the person's difficulties. Brain scans (usually MRI) are often performed in people with dementia to look for other problems. Sometimes the MRI can also help health care providers identify the type of dementia, since different types can have characteristic brain changes.  SAFETY AND LIFESTYLE ISSUES FOR PEOPLE WITH DEMENTIA A major issue for caregivers is making sure the person with dementia stays safe. Because many people with dementia do not realize that their mental functioning is impaired, they try to continue their day-to-day activities as usual. This can lead to physical danger, and caregivers must help to avoid situations that can threaten the safety of the person or others. The following information applies specifically to people with Alzheimer disease, but much of it is also relevant to people with other forms of dementia.  Medications -- People with Alzheimer disease often have trouble remembering to take medications they are prescribed for other conditions, or they become confused about which medications to take. They are also at increased risk for potentially dangerous side effects from certain medications. Sedatives and certain other drugs (such as some antihistamines and  antidepressants) may carry risk of increasing cognitive impairment. It is important to develop a plan for medication monitoring and  safety. People with dementia often need help taking their medications. It's a good idea to throw away old pill bottles and other medications that are no longer needed.  Driving -- Driving is often one of the first safety issues that arises in people with Alzheimer disease. In people with Alzheimer disease, the risk of having a car accident is significantly increased, especially as the disease progresses. It is best to discuss the issue of driving early, before the symptoms become advanced. Over time, everyone living long enough with dementia will reach a point where driving is too dangerous. Losing the ability to drive can be hard to accept because it represents independence for many people. It can also be challenging if the person does not completely appreciate their impairments in mental functioning or reaction time. In particular, driving at night may carry extra risk. Many people with mild but worrisome impairments will insist that they can safely drive locally or in the daytime. That may be true for the present time; however, people may forget that they have agreed to limitations. In addition, their ability to drive safely, even with restrictions, will deteriorate over time. A roadside driving test is often recommended if there is disagreement or uncertainty about a person's ability to drive. However, if a person with newly diagnosed, mild Alzheimer disease is deemed still able to drive, they will need to be reassessed every six months, with the understanding that driving will eventually no longer be possible. There may be important insurance implications of continued driving when medical records document advice to stop driving.  Cooking -- Cooking is another area that can lead to serious safety concerns and may require help or supervision. Symptoms such as distractibility, forgetfulness, and difficulty following directions can lead to burns, fires, or other injuries. The use of gas cooking appliance  raises a particular concern. A family member may have to ask the utility company to disconnect gas stoves if there is potential for accident or injury. Newer induction electric stoves do not change color when on, and may carry an inadvertent burn risk if a person forgets what they are doing.  Wandering -- As dementia progresses, some people with Alzheimer disease begin to wander. Because restlessness, distractibility, and memory problems are common, a person who wanders may easily become lost. Identification bracelets can help ensure that a lost wanderer gets home. The [employer] provides a "Wandering Support" program that provides ID tags and 24-hour assistance to patients who are preregistered for this program. There are many "locator" applications that allow the person with dementia to wear or carry a GPS device that a family member can track with their cell phone. Regular exercise may decrease the restlessness that can lead to wandering. Exercise is also just good practice to maintain strength, good sleep, and overall health. If wandering continues, wearable alarm systems are available that alert caretakers when the person leaves the home.  Falls -- Falls with secondary injuries are one of the most important causes of additional disability in people with all types of dementia, including Alzheimer disease. Commonly used medications can increase risk of falls and injuries. Hip fracture is a particular concern in older people, as it can lead to serious complications and sometimes even death. To reduce the risk of falls, potential tripping hazards such as loose electrical cords, slippery rugs, and clutter should be removed. Inadvertent hoarding may develop and  pose a safety risk. If the person lives alone, family members or elder services should perform safety inspections of the living space periodically. Medication lists should also be reviewed with your doctor to identify those that might  increase the risk of falling. Regular exercise, especially early in the course of dementia, and use of assistive devices like canes can also help with balance.  DEMENTIA TREATMENT Medications for Alzheimer disease -- Many people with Alzheimer disease will have the option of trying a medication. A trial of medication is usually begun for a period of a few to several weeks while the person is monitored for side effects and response. A health care provider should periodically review all medications to see if they are providing any benefit. It is important to have realistic expectations about the potential benefits of medication therapy in Alzheimer disease. None of these medications cure the disease, and the reality is that over time the person will continue to worsen. When medication does have an effect, the goal is not to stop progression of the disease, but to improve quality of life for the person and their family to the extent possible.  Treatment to slow or delay progression -- There is currently no cure for Alzheimer disease. However, experts are studying treatments in the hope of finding a way to slow the progression of the mental and functional decline, along with scientific efforts to prevent or delay onset.  Treatment of memory problems -- There are several medicines currently available for treating the memory problems associated with Alzheimer disease; they are also used in people with other forms of dementia.  ?Donepezil (brand name: Aricept) This medications allow more of a chemical called acetylcholine to be active in the brain, making up for drops in acetylcholine levels that happen in Alzheimer disease. It can cause side effects such as nausea, vomiting, and diarrhea in some people. It also may cause weight loss in many people. When taken at bedtime, cholinesterase inhibitors can cause very vivid dreams. If there is no improvement in symptoms or side effects are bothersome, the medication  should be stopped. Sometimes the person's symptoms will worsen after treatment is stopped; if this happens, the medication may be started again.  Treatment of behavioral symptoms -- The behavioral symptoms of Alzheimer disease are often more troubling than the cognitive (mental) symptoms. Even in mild cases, agitation, anxiety, and irritability can occur, and generally worsen as Alzheimer disease advances. This can be stressful for the person as well as for their family and caregivers. A combination of medications and behavioral therapy may be helpful. Non-medication therapies are preferred, as virtually all medications used for behavioral symptoms can increase confusion and many are associated with serious side effects and even an increased risk of death.  Depression -- Depression is common, especially in the early phases of dementia. It may be treated with behavioral therapy and/or with medications. The key is to recognize that depression may be playing a role in the person's symptoms. If depression is causing distress, it is worth treating. Potentially helpful medicines include a group of medicines known as selective serotonin reuptake inhibitors, or SSRIs, which are usually preferred over other choices in patients with dementia. Widely used SSRIs include fluoxetine (brand name: Prozac), sertraline (brand name: Zoloft), paroxetine (sample brand names: Brisdelle, Paxil), citalopram (brand name: Celexa), and escitalopram (brand names: Lexapro, Cipralex).   A variety of behavioral therapies are often helpful, do not have the side effects often seen with medications, and may be recommended for  depression. Behavioral therapy involves changing the person's environment (eg, regular exercise, avoiding triggers that cause sadness, socializing with others, engaging in pleasant activities that a person enjoys).  Agitation and aggression -- One of the most difficult issues for caregivers and people with Alzheimer  disease is aggressive behavior. Fortunately, this behavior is not common. However, many family members are reluctant to report aggressive behavior. In some cases, the behavior becomes physically abusive as dementia progresses. Agitation and aggression can be caused by a number of factors, including: ?Confusion, misunderstanding, or disorientation (doctors use the term "delirium" as a general term to describe a state of confusion in which a person does not think or behave normally) ?Frightening or paranoid delusions or hallucinations ?Depression or anxiety ?Sleep disorders, such as reduced sleep or altered sleep/wake cycles ?Certain medical conditions that can cause delirium, such as urinary tract infection or pneumonia ?Being in physical pain or discomfort ?Side effects of certain medications Delusions (ie, believing something that is not real or true) are common in patients with dementia, occurring in up to 30 percent of those with advanced disease. Paranoid delusions are particularly distressing to both the patients and the caregivers: these often include beliefs that someone has invaded the house, that family members have been replaced by impostors, that spouses have been unfaithful, or that personal possessions have been stolen.   Family members should discuss any concerns about aggressive behavior with a health care provider and arrange for help if necessary. The best treatment for these symptoms depends upon what triggers them. As an example, a person who becomes aggressive during periods of confusion might best be treated by talking through the problem, while someone who becomes aggressive during delusions might require medication. Often, behavior improves once an underlying medical condition is treated. Caregivers can learn strategies to help lessen the number of triggers and confrontations.   Sleep problems -- Sleep disorders can be treated with either medicine or behavior changes or both: for  example, limiting daytime naps, increasing physical activity, avoiding caffeine and alcohol in the evening. In some people, medication to help with sleep may be recommended, although these medications almost always have side effects (eg, worsened confusion and increased risk of falls). Maintaining daily rhythms, using artificial lighting when needed during the day, and avoiding bright light exposures during the night may help maintain normal wake-sleep cycles.   COPING WITH DEMENTIA Being diagnosed with any form of dementia can be distressing and overwhelming for the person affected as well as their loved ones. For people with dementia -- It is important for people with early dementia to care for their physical and mental health. This means getting regular checkups, taking medicines if needed, eating a healthy diet, exercising regularly, getting enough sleep, and avoiding activities that may be risky. It is often helpful to talk to others through support groups or a counselor or social worker to discuss any feelings of anxiety, frustration, anger, loneliness, or depression. All of these feelings are normal, and dealing with these feelings can help you to feel more in control of your life and health. It can also help to talk to other people who are going through a similar experience. Another issue to consider is how to tell your family and friends about your diagnosis. Explaining the disease can help others to understand what to expect and how they can help, now and in the future. This can be especially helpful for children and grandchildren, who may not be familiar with the condition. While many people  are able to live alone in the early stages of dementia, you may need help with tasks such as housekeeping, cooking, transportation, and paying bills. If possible, ask a friend or family member for help making plans to deal with these and other issues as dementia progresses. Occupational therapists, and  sometimes speech pathologists, can help to set up your home to minimize confusion and keep you independent for as long as possible. It's also important to establish Power of Attorney and Health Care Proxy statuses early, before a financial or health care crisis happens. This involves completing paperwork to determine who can make decisions on your behalf if needed. In addition, you should discuss your preferences regarding issues that are likely to become important as your dementia worsens, including: ?Is health insurance available, and what does it cover? ?Where will I live? ?Who will make health care and end-of-life decisions if I can't make them for myself? ?Who will pay for care? A number of resources are available to assist in this type of planning.   For caregivers -- Dementia can also impose an enormous burden on families and other caregivers. People with dementia become less able to care for themselves as the condition progresses. If you are caring for someone with dementia, the following may help: ?Make a daily plan and prepare to be flexible if needed. ?Try to be patient when responding to repetitive questions, behaviors, or statements. ?Try not to argue or confront the person with dementia when they express mistaken ideas or facts. Change the subject or gently remind the person of an inaccuracy. Arguing or trying to convince a person of "the truth" is a natural reaction but it can be frustrating to all and can trigger unwanted behavior and feelings. ?Use memory aids such as writing out a list of daily activities, phone numbers, and instructions for usual tasks (ie, the telephone, microwave, etc). It may help if these are posted and easily visible so that the person need not remember to look for the aids. ?Establish calm and consistent nighttime routines to manage behavioral problems, which are often worst at night. Leave a night light on in the person's bedroom. ?Avoid major changes to the  home environment (for example, rearranging furniture). ?Employ safety measures in the home, such as putting locks on medicine cabinets, keeping furniture in the same place to prevent falls, reducing clutter, removing electrical appliances from the bathroom, installing grab bars in the bathroom, and setting the water heater below 120F. ?Help the person with personal care tasks as needed. It is not necessary to bathe every day, although a health care provider should be notified if the person develops sores in the mouth or genitals related to hygiene problems (eg, ill-fitting dentures, urine leakage). ?Speak slowly, present only one idea at a time, and be patient when waiting for responses. ?Encourage physical activity and exercise. Even a daily walk can help prevent physical decline and improve behavioral problems. ?Consider respite care. Respite care can provide a needed break and give you a chance to recharge. This is offered in many communities in the form of in-home care or adult day care. Caregiving can be an all-consuming experience, and it's essential to take time for yourself, take care of your own medical problems, and arrange for breaks when you need them. ?See if your area has a support group for people caring for loved ones with dementia. It can help to talk with other people who understand what you are going through.

## 2022-10-30 NOTE — Progress Notes (Signed)
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 Brain MRI 08/27/21 with mild generalized cerebral atrophy, slightly advanced for age.  At her last visit she was started on donepezil for memory. Sinemet was started for tremor and rigidity.  Interval History: The patient's son states her ability to communicate has declined rapidly in the past 4 months. Most responses are "yes" or "no". She can't hold a conversation and struggles to follow directions. Can remember things remotely but needs a lot of prompting. She has been sleeping more lately as well. She is still living alone, but her brother comes and stays her in the afternoon. She does her own laundry but otherwise her sons assist with her ADLs. Appetite is good if meals are placed in front of her. She is not cooking herself. Her son will make sure she has dinner but is not always sure if she is eating breakfast or lunch. She has someone come over during the day to help with bathing. She is no longer driving. Sons help pay her bills and manage her medication. Continues to take donepezil without issues.  She continues to have tremors. Tried to take Sinemet but stopped because she struggled with the dosing schedule. She is less steady but has not had any falls. Saw PT and is getting a walker. Denies visual hallucinations.  She underwent neuropsychological testing 09/07/22 which was most consistent with Alzheimer's disease, though Lewy Body and Parkinson's could not be ruled out.   Physical Exam:   Vital Signs: BP (!) 147/70 (BP Location: Right Arm, Patient Position: Sitting, Cuff Size: Normal)   Pulse 91   Ht 5\' 5"  (1.651 m)   Wt 91 lb 9.6 oz (41.5 kg)   BMI 15.24 kg/m  GENERAL:  well appearing, in no acute distress, alert  SKIN:  Color, texture, turgor normal. No rashes or lesions HEAD:  Normocephalic/atraumatic. RESP:  normal respiratory effort  NEUROLOGICAL: Mental Status:     10/30/2022    3:47 PM 03/20/2022    3:15 PM 08/17/2021   10:17 AM  Montreal Cognitive Assessment   Visuospatial/ Executive (0/5) 3 1 1   Naming (0/3) 3 2 2   Attention: Read list of digits (0/2) 0 1 1  Attention: Read list of letters (0/1) 0 0 0  Attention: Serial 7 subtraction starting at 100 (0/3) 1 0 2  Language: Repeat phrase (0/2) 1 1 0  Language : Fluency (0/1) 0 0 0  Abstraction (0/2) 2 1 2   Delayed Recall (0/5) 0 0 0  Orientation (0/6) 5 4 5   Total 15 10 13   Adjusted Score (based on education) 15 11   Only responds with "yes" or "no".  Cranial Nerves: PERRL, face symmetric with masked facies, no dysarthria but voice is hypophonic, hearing grossly intact Motor: moves all extremities equally. Resting and postural tremor present bilaterally. Mild bradykinesia on fine finger movements and toe tapping.  Gait: decreased stride length, decreased arm swing on left   IMPRESSION: 77 year old female with a history of HTN and MGUS who presents for follow up of memory loss and tremor. Neuropsychological testing was most consistent with Alzheimer's disease, though Lewy Body and Parkinson's could not be ruled out. Her functioning has continued to decline. Will continue donepezil and add Namenda. Will re-trial Sinemet with BID dosing and see if this is easier for her to manage.  PLAN: -Continue donepezil 10 mg daily -Start Namenda 5 mg at bedtime, uptitrate by 5 mg weekly up to 10 mg BID -Start sinemet 25-100: take 1/2 pill BID x1 week, then take 1 pill BID  Follow-up: 6 months  I spent a total of 57 minutes on the date of the service. Discussed medication side effects, adverse reactions and drug interactions. Written educational materials and patient instructions outlining all of the above were given.  [name], MD 10/30/22 4:41 PM

## 2022-10-31 DIAGNOSIS — I10 Essential (primary) hypertension: Secondary | ICD-10-CM | POA: Diagnosis not present

## 2022-10-31 DIAGNOSIS — F0390 Unspecified dementia without behavioral disturbance: Secondary | ICD-10-CM | POA: Diagnosis not present

## 2022-10-31 DIAGNOSIS — R634 Abnormal weight loss: Secondary | ICD-10-CM | POA: Diagnosis not present

## 2022-10-31 DIAGNOSIS — J449 Chronic obstructive pulmonary disease, unspecified: Secondary | ICD-10-CM | POA: Diagnosis not present

## 2022-10-31 DIAGNOSIS — R251 Tremor, unspecified: Secondary | ICD-10-CM | POA: Diagnosis not present

## 2022-10-31 DIAGNOSIS — Z87891 Personal history of nicotine dependence: Secondary | ICD-10-CM | POA: Diagnosis not present

## 2022-10-31 DIAGNOSIS — M810 Age-related osteoporosis without current pathological fracture: Secondary | ICD-10-CM | POA: Diagnosis not present

## 2022-10-31 DIAGNOSIS — R0609 Other forms of dyspnea: Secondary | ICD-10-CM | POA: Diagnosis not present

## 2022-10-31 DIAGNOSIS — R918 Other nonspecific abnormal finding of lung field: Secondary | ICD-10-CM | POA: Diagnosis not present

## 2022-10-31 DIAGNOSIS — C342 Malignant neoplasm of middle lobe, bronchus or lung: Secondary | ICD-10-CM | POA: Diagnosis not present

## 2022-10-31 DIAGNOSIS — R2681 Unsteadiness on feet: Secondary | ICD-10-CM | POA: Diagnosis not present

## 2022-10-31 DIAGNOSIS — Z2839 Other underimmunization status: Secondary | ICD-10-CM | POA: Diagnosis not present

## 2022-10-31 DIAGNOSIS — Z23 Encounter for immunization: Secondary | ICD-10-CM | POA: Diagnosis not present

## 2022-11-02 DIAGNOSIS — Z87891 Personal history of nicotine dependence: Secondary | ICD-10-CM | POA: Diagnosis not present

## 2022-11-02 DIAGNOSIS — R2681 Unsteadiness on feet: Secondary | ICD-10-CM | POA: Diagnosis not present

## 2022-11-02 DIAGNOSIS — M810 Age-related osteoporosis without current pathological fracture: Secondary | ICD-10-CM | POA: Diagnosis not present

## 2022-11-02 DIAGNOSIS — R251 Tremor, unspecified: Secondary | ICD-10-CM | POA: Diagnosis not present

## 2022-11-02 DIAGNOSIS — F0390 Unspecified dementia without behavioral disturbance: Secondary | ICD-10-CM | POA: Diagnosis not present

## 2022-11-02 DIAGNOSIS — R918 Other nonspecific abnormal finding of lung field: Secondary | ICD-10-CM | POA: Diagnosis not present

## 2022-11-02 DIAGNOSIS — J449 Chronic obstructive pulmonary disease, unspecified: Secondary | ICD-10-CM | POA: Diagnosis not present

## 2022-11-02 DIAGNOSIS — R634 Abnormal weight loss: Secondary | ICD-10-CM | POA: Diagnosis not present

## 2022-11-02 DIAGNOSIS — I10 Essential (primary) hypertension: Secondary | ICD-10-CM | POA: Diagnosis not present

## 2022-11-06 DIAGNOSIS — Z87891 Personal history of nicotine dependence: Secondary | ICD-10-CM | POA: Diagnosis not present

## 2022-11-06 DIAGNOSIS — C342 Malignant neoplasm of middle lobe, bronchus or lung: Secondary | ICD-10-CM | POA: Diagnosis not present

## 2022-11-07 DIAGNOSIS — Z87891 Personal history of nicotine dependence: Secondary | ICD-10-CM | POA: Diagnosis not present

## 2022-11-07 DIAGNOSIS — R918 Other nonspecific abnormal finding of lung field: Secondary | ICD-10-CM | POA: Diagnosis not present

## 2022-11-07 DIAGNOSIS — J449 Chronic obstructive pulmonary disease, unspecified: Secondary | ICD-10-CM | POA: Diagnosis not present

## 2022-11-07 DIAGNOSIS — M810 Age-related osteoporosis without current pathological fracture: Secondary | ICD-10-CM | POA: Diagnosis not present

## 2022-11-07 DIAGNOSIS — R634 Abnormal weight loss: Secondary | ICD-10-CM | POA: Diagnosis not present

## 2022-11-07 DIAGNOSIS — I10 Essential (primary) hypertension: Secondary | ICD-10-CM | POA: Diagnosis not present

## 2022-11-07 DIAGNOSIS — R251 Tremor, unspecified: Secondary | ICD-10-CM | POA: Diagnosis not present

## 2022-11-07 DIAGNOSIS — R2681 Unsteadiness on feet: Secondary | ICD-10-CM | POA: Diagnosis not present

## 2022-11-07 DIAGNOSIS — F0390 Unspecified dementia without behavioral disturbance: Secondary | ICD-10-CM | POA: Diagnosis not present

## 2022-11-09 DIAGNOSIS — Z87891 Personal history of nicotine dependence: Secondary | ICD-10-CM | POA: Diagnosis not present

## 2022-11-09 DIAGNOSIS — R251 Tremor, unspecified: Secondary | ICD-10-CM | POA: Diagnosis not present

## 2022-11-09 DIAGNOSIS — F0390 Unspecified dementia without behavioral disturbance: Secondary | ICD-10-CM | POA: Diagnosis not present

## 2022-11-09 DIAGNOSIS — R634 Abnormal weight loss: Secondary | ICD-10-CM | POA: Diagnosis not present

## 2022-11-09 DIAGNOSIS — I10 Essential (primary) hypertension: Secondary | ICD-10-CM | POA: Diagnosis not present

## 2022-11-09 DIAGNOSIS — M810 Age-related osteoporosis without current pathological fracture: Secondary | ICD-10-CM | POA: Diagnosis not present

## 2022-11-09 DIAGNOSIS — J449 Chronic obstructive pulmonary disease, unspecified: Secondary | ICD-10-CM | POA: Diagnosis not present

## 2022-11-09 DIAGNOSIS — R918 Other nonspecific abnormal finding of lung field: Secondary | ICD-10-CM | POA: Diagnosis not present

## 2022-11-09 DIAGNOSIS — R2681 Unsteadiness on feet: Secondary | ICD-10-CM | POA: Diagnosis not present

## 2022-11-17 DIAGNOSIS — M810 Age-related osteoporosis without current pathological fracture: Secondary | ICD-10-CM | POA: Diagnosis not present

## 2022-11-17 DIAGNOSIS — R2681 Unsteadiness on feet: Secondary | ICD-10-CM | POA: Diagnosis not present

## 2022-11-17 DIAGNOSIS — J449 Chronic obstructive pulmonary disease, unspecified: Secondary | ICD-10-CM | POA: Diagnosis not present

## 2022-11-17 DIAGNOSIS — R634 Abnormal weight loss: Secondary | ICD-10-CM | POA: Diagnosis not present

## 2022-11-17 DIAGNOSIS — R918 Other nonspecific abnormal finding of lung field: Secondary | ICD-10-CM | POA: Diagnosis not present

## 2022-11-17 DIAGNOSIS — Z87891 Personal history of nicotine dependence: Secondary | ICD-10-CM | POA: Diagnosis not present

## 2022-11-17 DIAGNOSIS — R251 Tremor, unspecified: Secondary | ICD-10-CM | POA: Diagnosis not present

## 2022-11-17 DIAGNOSIS — F0390 Unspecified dementia without behavioral disturbance: Secondary | ICD-10-CM | POA: Diagnosis not present

## 2022-11-17 DIAGNOSIS — I10 Essential (primary) hypertension: Secondary | ICD-10-CM | POA: Diagnosis not present

## 2022-11-23 DIAGNOSIS — Z87891 Personal history of nicotine dependence: Secondary | ICD-10-CM | POA: Diagnosis not present

## 2022-11-23 DIAGNOSIS — R2681 Unsteadiness on feet: Secondary | ICD-10-CM | POA: Diagnosis not present

## 2022-11-23 DIAGNOSIS — J449 Chronic obstructive pulmonary disease, unspecified: Secondary | ICD-10-CM | POA: Diagnosis not present

## 2022-11-23 DIAGNOSIS — R918 Other nonspecific abnormal finding of lung field: Secondary | ICD-10-CM | POA: Diagnosis not present

## 2022-11-23 DIAGNOSIS — R251 Tremor, unspecified: Secondary | ICD-10-CM | POA: Diagnosis not present

## 2022-11-23 DIAGNOSIS — M810 Age-related osteoporosis without current pathological fracture: Secondary | ICD-10-CM | POA: Diagnosis not present

## 2022-11-23 DIAGNOSIS — I10 Essential (primary) hypertension: Secondary | ICD-10-CM | POA: Diagnosis not present

## 2022-11-23 DIAGNOSIS — F0390 Unspecified dementia without behavioral disturbance: Secondary | ICD-10-CM | POA: Diagnosis not present

## 2022-11-23 DIAGNOSIS — R634 Abnormal weight loss: Secondary | ICD-10-CM | POA: Diagnosis not present

## 2022-11-28 DIAGNOSIS — F0390 Unspecified dementia without behavioral disturbance: Secondary | ICD-10-CM | POA: Diagnosis not present

## 2022-11-28 DIAGNOSIS — R251 Tremor, unspecified: Secondary | ICD-10-CM | POA: Diagnosis not present

## 2022-11-28 DIAGNOSIS — R634 Abnormal weight loss: Secondary | ICD-10-CM | POA: Diagnosis not present

## 2022-11-28 DIAGNOSIS — R918 Other nonspecific abnormal finding of lung field: Secondary | ICD-10-CM | POA: Diagnosis not present

## 2022-11-28 DIAGNOSIS — R2681 Unsteadiness on feet: Secondary | ICD-10-CM | POA: Diagnosis not present

## 2022-11-28 DIAGNOSIS — J449 Chronic obstructive pulmonary disease, unspecified: Secondary | ICD-10-CM | POA: Diagnosis not present

## 2022-11-28 DIAGNOSIS — M810 Age-related osteoporosis without current pathological fracture: Secondary | ICD-10-CM | POA: Diagnosis not present

## 2022-11-28 DIAGNOSIS — I10 Essential (primary) hypertension: Secondary | ICD-10-CM | POA: Diagnosis not present

## 2022-11-28 DIAGNOSIS — Z87891 Personal history of nicotine dependence: Secondary | ICD-10-CM | POA: Diagnosis not present

## 2022-11-29 DIAGNOSIS — M810 Age-related osteoporosis without current pathological fracture: Secondary | ICD-10-CM | POA: Diagnosis not present

## 2022-11-29 DIAGNOSIS — I10 Essential (primary) hypertension: Secondary | ICD-10-CM | POA: Diagnosis not present

## 2022-11-29 DIAGNOSIS — R251 Tremor, unspecified: Secondary | ICD-10-CM | POA: Diagnosis not present

## 2022-11-29 DIAGNOSIS — R2681 Unsteadiness on feet: Secondary | ICD-10-CM | POA: Diagnosis not present

## 2022-11-29 DIAGNOSIS — Z87891 Personal history of nicotine dependence: Secondary | ICD-10-CM | POA: Diagnosis not present

## 2022-11-29 DIAGNOSIS — R634 Abnormal weight loss: Secondary | ICD-10-CM | POA: Diagnosis not present

## 2022-11-29 DIAGNOSIS — F0390 Unspecified dementia without behavioral disturbance: Secondary | ICD-10-CM | POA: Diagnosis not present

## 2022-11-29 DIAGNOSIS — J449 Chronic obstructive pulmonary disease, unspecified: Secondary | ICD-10-CM | POA: Diagnosis not present

## 2022-11-29 DIAGNOSIS — R918 Other nonspecific abnormal finding of lung field: Secondary | ICD-10-CM | POA: Diagnosis not present

## 2022-12-01 DIAGNOSIS — M810 Age-related osteoporosis without current pathological fracture: Secondary | ICD-10-CM | POA: Diagnosis not present

## 2022-12-01 DIAGNOSIS — F0390 Unspecified dementia without behavioral disturbance: Secondary | ICD-10-CM | POA: Diagnosis not present

## 2022-12-01 DIAGNOSIS — I10 Essential (primary) hypertension: Secondary | ICD-10-CM | POA: Diagnosis not present

## 2022-12-01 DIAGNOSIS — R2681 Unsteadiness on feet: Secondary | ICD-10-CM | POA: Diagnosis not present

## 2022-12-01 DIAGNOSIS — J449 Chronic obstructive pulmonary disease, unspecified: Secondary | ICD-10-CM | POA: Diagnosis not present

## 2022-12-01 DIAGNOSIS — R251 Tremor, unspecified: Secondary | ICD-10-CM | POA: Diagnosis not present

## 2022-12-01 DIAGNOSIS — R634 Abnormal weight loss: Secondary | ICD-10-CM | POA: Diagnosis not present

## 2022-12-01 DIAGNOSIS — R918 Other nonspecific abnormal finding of lung field: Secondary | ICD-10-CM | POA: Diagnosis not present

## 2022-12-04 ENCOUNTER — Encounter: Payer: Self-pay | Admitting: Neurology

## 2022-12-07 DIAGNOSIS — I10 Essential (primary) hypertension: Secondary | ICD-10-CM | POA: Diagnosis not present

## 2022-12-07 DIAGNOSIS — R251 Tremor, unspecified: Secondary | ICD-10-CM | POA: Diagnosis not present

## 2022-12-07 DIAGNOSIS — R634 Abnormal weight loss: Secondary | ICD-10-CM | POA: Diagnosis not present

## 2022-12-07 DIAGNOSIS — Z87891 Personal history of nicotine dependence: Secondary | ICD-10-CM | POA: Diagnosis not present

## 2022-12-07 DIAGNOSIS — R918 Other nonspecific abnormal finding of lung field: Secondary | ICD-10-CM | POA: Diagnosis not present

## 2022-12-07 DIAGNOSIS — M810 Age-related osteoporosis without current pathological fracture: Secondary | ICD-10-CM | POA: Diagnosis not present

## 2022-12-07 DIAGNOSIS — J449 Chronic obstructive pulmonary disease, unspecified: Secondary | ICD-10-CM | POA: Diagnosis not present

## 2022-12-07 DIAGNOSIS — F0390 Unspecified dementia without behavioral disturbance: Secondary | ICD-10-CM | POA: Diagnosis not present

## 2022-12-07 DIAGNOSIS — R2681 Unsteadiness on feet: Secondary | ICD-10-CM | POA: Diagnosis not present

## 2022-12-07 MED ORDER — MEMANTINE HCL 10 MG PO TABS: 10.0000 mg | ORAL_TABLET | Freq: Two times a day (BID) | ORAL | 3 refills | Status: DC

## 2022-12-07 NOTE — Addendum Note (Signed)
Addended by: Aura Camps on: 12/07/2022 02:18 PM   Modules accepted: Orders

## 2022-12-07 NOTE — Telephone Encounter (Addendum)
Per note on 10/30/22 " Start Namenda 5 mg at bedtime, uptitrate by 5 mg weekly up to 10 mg BID "  Rx sent.

## 2022-12-08 DIAGNOSIS — F0390 Unspecified dementia without behavioral disturbance: Secondary | ICD-10-CM | POA: Diagnosis not present

## 2022-12-08 DIAGNOSIS — Z87891 Personal history of nicotine dependence: Secondary | ICD-10-CM | POA: Diagnosis not present

## 2022-12-08 DIAGNOSIS — M810 Age-related osteoporosis without current pathological fracture: Secondary | ICD-10-CM | POA: Diagnosis not present

## 2022-12-08 DIAGNOSIS — R251 Tremor, unspecified: Secondary | ICD-10-CM | POA: Diagnosis not present

## 2022-12-08 DIAGNOSIS — I10 Essential (primary) hypertension: Secondary | ICD-10-CM | POA: Diagnosis not present

## 2022-12-08 DIAGNOSIS — J449 Chronic obstructive pulmonary disease, unspecified: Secondary | ICD-10-CM | POA: Diagnosis not present

## 2022-12-08 DIAGNOSIS — R634 Abnormal weight loss: Secondary | ICD-10-CM | POA: Diagnosis not present

## 2022-12-08 DIAGNOSIS — R2681 Unsteadiness on feet: Secondary | ICD-10-CM | POA: Diagnosis not present

## 2022-12-08 DIAGNOSIS — R918 Other nonspecific abnormal finding of lung field: Secondary | ICD-10-CM | POA: Diagnosis not present

## 2022-12-12 DIAGNOSIS — M810 Age-related osteoporosis without current pathological fracture: Secondary | ICD-10-CM | POA: Diagnosis not present

## 2022-12-12 DIAGNOSIS — R251 Tremor, unspecified: Secondary | ICD-10-CM | POA: Diagnosis not present

## 2022-12-12 DIAGNOSIS — J449 Chronic obstructive pulmonary disease, unspecified: Secondary | ICD-10-CM | POA: Diagnosis not present

## 2022-12-12 DIAGNOSIS — I10 Essential (primary) hypertension: Secondary | ICD-10-CM | POA: Diagnosis not present

## 2022-12-12 DIAGNOSIS — R918 Other nonspecific abnormal finding of lung field: Secondary | ICD-10-CM | POA: Diagnosis not present

## 2022-12-12 DIAGNOSIS — R634 Abnormal weight loss: Secondary | ICD-10-CM | POA: Diagnosis not present

## 2022-12-12 DIAGNOSIS — R2681 Unsteadiness on feet: Secondary | ICD-10-CM | POA: Diagnosis not present

## 2022-12-12 DIAGNOSIS — Z87891 Personal history of nicotine dependence: Secondary | ICD-10-CM | POA: Diagnosis not present

## 2022-12-12 DIAGNOSIS — F0390 Unspecified dementia without behavioral disturbance: Secondary | ICD-10-CM | POA: Diagnosis not present

## 2022-12-14 DIAGNOSIS — J449 Chronic obstructive pulmonary disease, unspecified: Secondary | ICD-10-CM | POA: Diagnosis not present

## 2022-12-14 DIAGNOSIS — Z87891 Personal history of nicotine dependence: Secondary | ICD-10-CM | POA: Diagnosis not present

## 2022-12-14 DIAGNOSIS — M810 Age-related osteoporosis without current pathological fracture: Secondary | ICD-10-CM | POA: Diagnosis not present

## 2022-12-14 DIAGNOSIS — F0390 Unspecified dementia without behavioral disturbance: Secondary | ICD-10-CM | POA: Diagnosis not present

## 2022-12-14 DIAGNOSIS — R2681 Unsteadiness on feet: Secondary | ICD-10-CM | POA: Diagnosis not present

## 2022-12-14 DIAGNOSIS — R634 Abnormal weight loss: Secondary | ICD-10-CM | POA: Diagnosis not present

## 2022-12-14 DIAGNOSIS — I10 Essential (primary) hypertension: Secondary | ICD-10-CM | POA: Diagnosis not present

## 2022-12-14 DIAGNOSIS — R251 Tremor, unspecified: Secondary | ICD-10-CM | POA: Diagnosis not present

## 2022-12-14 DIAGNOSIS — R918 Other nonspecific abnormal finding of lung field: Secondary | ICD-10-CM | POA: Diagnosis not present

## 2022-12-15 DIAGNOSIS — M810 Age-related osteoporosis without current pathological fracture: Secondary | ICD-10-CM | POA: Diagnosis not present

## 2022-12-15 DIAGNOSIS — R2681 Unsteadiness on feet: Secondary | ICD-10-CM | POA: Diagnosis not present

## 2022-12-15 DIAGNOSIS — I10 Essential (primary) hypertension: Secondary | ICD-10-CM | POA: Diagnosis not present

## 2022-12-15 DIAGNOSIS — F0390 Unspecified dementia without behavioral disturbance: Secondary | ICD-10-CM | POA: Diagnosis not present

## 2022-12-15 DIAGNOSIS — C342 Malignant neoplasm of middle lobe, bronchus or lung: Secondary | ICD-10-CM | POA: Diagnosis not present

## 2022-12-15 DIAGNOSIS — R5383 Other fatigue: Secondary | ICD-10-CM | POA: Diagnosis not present

## 2022-12-15 DIAGNOSIS — Z0000 Encounter for general adult medical examination without abnormal findings: Secondary | ICD-10-CM | POA: Diagnosis not present

## 2022-12-19 DIAGNOSIS — F0390 Unspecified dementia without behavioral disturbance: Secondary | ICD-10-CM | POA: Diagnosis not present

## 2022-12-19 DIAGNOSIS — R918 Other nonspecific abnormal finding of lung field: Secondary | ICD-10-CM | POA: Diagnosis not present

## 2022-12-19 DIAGNOSIS — R634 Abnormal weight loss: Secondary | ICD-10-CM | POA: Diagnosis not present

## 2022-12-19 DIAGNOSIS — J449 Chronic obstructive pulmonary disease, unspecified: Secondary | ICD-10-CM | POA: Diagnosis not present

## 2022-12-19 DIAGNOSIS — I10 Essential (primary) hypertension: Secondary | ICD-10-CM | POA: Diagnosis not present

## 2022-12-19 DIAGNOSIS — R251 Tremor, unspecified: Secondary | ICD-10-CM | POA: Diagnosis not present

## 2022-12-19 DIAGNOSIS — R2681 Unsteadiness on feet: Secondary | ICD-10-CM | POA: Diagnosis not present

## 2022-12-19 DIAGNOSIS — M810 Age-related osteoporosis without current pathological fracture: Secondary | ICD-10-CM | POA: Diagnosis not present

## 2022-12-19 DIAGNOSIS — Z87891 Personal history of nicotine dependence: Secondary | ICD-10-CM | POA: Diagnosis not present

## 2022-12-21 DIAGNOSIS — L603 Nail dystrophy: Secondary | ICD-10-CM | POA: Diagnosis not present

## 2022-12-21 DIAGNOSIS — I739 Peripheral vascular disease, unspecified: Secondary | ICD-10-CM | POA: Diagnosis not present

## 2022-12-21 DIAGNOSIS — L84 Corns and callosities: Secondary | ICD-10-CM | POA: Diagnosis not present

## 2022-12-21 DIAGNOSIS — R251 Tremor, unspecified: Secondary | ICD-10-CM | POA: Diagnosis not present

## 2022-12-21 DIAGNOSIS — I10 Essential (primary) hypertension: Secondary | ICD-10-CM | POA: Diagnosis not present

## 2022-12-21 DIAGNOSIS — R918 Other nonspecific abnormal finding of lung field: Secondary | ICD-10-CM | POA: Diagnosis not present

## 2022-12-21 DIAGNOSIS — R634 Abnormal weight loss: Secondary | ICD-10-CM | POA: Diagnosis not present

## 2022-12-21 DIAGNOSIS — Z87891 Personal history of nicotine dependence: Secondary | ICD-10-CM | POA: Diagnosis not present

## 2022-12-21 DIAGNOSIS — J449 Chronic obstructive pulmonary disease, unspecified: Secondary | ICD-10-CM | POA: Diagnosis not present

## 2022-12-21 DIAGNOSIS — M810 Age-related osteoporosis without current pathological fracture: Secondary | ICD-10-CM | POA: Diagnosis not present

## 2022-12-21 DIAGNOSIS — F0390 Unspecified dementia without behavioral disturbance: Secondary | ICD-10-CM | POA: Diagnosis not present

## 2022-12-21 DIAGNOSIS — R2681 Unsteadiness on feet: Secondary | ICD-10-CM | POA: Diagnosis not present

## 2022-12-27 DIAGNOSIS — I10 Essential (primary) hypertension: Secondary | ICD-10-CM | POA: Diagnosis not present

## 2022-12-27 DIAGNOSIS — M810 Age-related osteoporosis without current pathological fracture: Secondary | ICD-10-CM | POA: Diagnosis not present

## 2022-12-27 DIAGNOSIS — Z87891 Personal history of nicotine dependence: Secondary | ICD-10-CM | POA: Diagnosis not present

## 2022-12-27 DIAGNOSIS — R634 Abnormal weight loss: Secondary | ICD-10-CM | POA: Diagnosis not present

## 2022-12-27 DIAGNOSIS — F0390 Unspecified dementia without behavioral disturbance: Secondary | ICD-10-CM | POA: Diagnosis not present

## 2022-12-27 DIAGNOSIS — R2681 Unsteadiness on feet: Secondary | ICD-10-CM | POA: Diagnosis not present

## 2022-12-27 DIAGNOSIS — R918 Other nonspecific abnormal finding of lung field: Secondary | ICD-10-CM | POA: Diagnosis not present

## 2022-12-27 DIAGNOSIS — J449 Chronic obstructive pulmonary disease, unspecified: Secondary | ICD-10-CM | POA: Diagnosis not present

## 2022-12-27 DIAGNOSIS — R251 Tremor, unspecified: Secondary | ICD-10-CM | POA: Diagnosis not present

## 2023-01-03 DIAGNOSIS — I10 Essential (primary) hypertension: Secondary | ICD-10-CM | POA: Diagnosis not present

## 2023-01-03 DIAGNOSIS — M810 Age-related osteoporosis without current pathological fracture: Secondary | ICD-10-CM | POA: Diagnosis not present

## 2023-01-03 DIAGNOSIS — R251 Tremor, unspecified: Secondary | ICD-10-CM | POA: Diagnosis not present

## 2023-01-03 DIAGNOSIS — F0390 Unspecified dementia without behavioral disturbance: Secondary | ICD-10-CM | POA: Diagnosis not present

## 2023-01-03 DIAGNOSIS — R918 Other nonspecific abnormal finding of lung field: Secondary | ICD-10-CM | POA: Diagnosis not present

## 2023-01-03 DIAGNOSIS — Z87891 Personal history of nicotine dependence: Secondary | ICD-10-CM | POA: Diagnosis not present

## 2023-01-03 DIAGNOSIS — J449 Chronic obstructive pulmonary disease, unspecified: Secondary | ICD-10-CM | POA: Diagnosis not present

## 2023-01-03 DIAGNOSIS — R634 Abnormal weight loss: Secondary | ICD-10-CM | POA: Diagnosis not present

## 2023-01-03 DIAGNOSIS — R2681 Unsteadiness on feet: Secondary | ICD-10-CM | POA: Diagnosis not present

## 2023-01-04 DIAGNOSIS — F0390 Unspecified dementia without behavioral disturbance: Secondary | ICD-10-CM | POA: Diagnosis not present

## 2023-01-04 DIAGNOSIS — M810 Age-related osteoporosis without current pathological fracture: Secondary | ICD-10-CM | POA: Diagnosis not present

## 2023-01-04 DIAGNOSIS — R251 Tremor, unspecified: Secondary | ICD-10-CM | POA: Diagnosis not present

## 2023-01-04 DIAGNOSIS — I10 Essential (primary) hypertension: Secondary | ICD-10-CM | POA: Diagnosis not present

## 2023-01-04 DIAGNOSIS — J449 Chronic obstructive pulmonary disease, unspecified: Secondary | ICD-10-CM | POA: Diagnosis not present

## 2023-01-04 DIAGNOSIS — R634 Abnormal weight loss: Secondary | ICD-10-CM | POA: Diagnosis not present

## 2023-01-04 DIAGNOSIS — R2681 Unsteadiness on feet: Secondary | ICD-10-CM | POA: Diagnosis not present

## 2023-01-04 DIAGNOSIS — R918 Other nonspecific abnormal finding of lung field: Secondary | ICD-10-CM | POA: Diagnosis not present

## 2023-01-04 DIAGNOSIS — Z87891 Personal history of nicotine dependence: Secondary | ICD-10-CM | POA: Diagnosis not present

## 2023-01-11 DIAGNOSIS — I10 Essential (primary) hypertension: Secondary | ICD-10-CM | POA: Diagnosis not present

## 2023-01-11 DIAGNOSIS — R2681 Unsteadiness on feet: Secondary | ICD-10-CM | POA: Diagnosis not present

## 2023-01-11 DIAGNOSIS — Z87891 Personal history of nicotine dependence: Secondary | ICD-10-CM | POA: Diagnosis not present

## 2023-01-11 DIAGNOSIS — R918 Other nonspecific abnormal finding of lung field: Secondary | ICD-10-CM | POA: Diagnosis not present

## 2023-01-11 DIAGNOSIS — R634 Abnormal weight loss: Secondary | ICD-10-CM | POA: Diagnosis not present

## 2023-01-11 DIAGNOSIS — M810 Age-related osteoporosis without current pathological fracture: Secondary | ICD-10-CM | POA: Diagnosis not present

## 2023-01-11 DIAGNOSIS — F0390 Unspecified dementia without behavioral disturbance: Secondary | ICD-10-CM | POA: Diagnosis not present

## 2023-01-11 DIAGNOSIS — R251 Tremor, unspecified: Secondary | ICD-10-CM | POA: Diagnosis not present

## 2023-01-11 DIAGNOSIS — J449 Chronic obstructive pulmonary disease, unspecified: Secondary | ICD-10-CM | POA: Diagnosis not present

## 2023-01-15 DIAGNOSIS — R2681 Unsteadiness on feet: Secondary | ICD-10-CM | POA: Diagnosis not present

## 2023-01-15 DIAGNOSIS — I10 Essential (primary) hypertension: Secondary | ICD-10-CM | POA: Diagnosis not present

## 2023-01-15 DIAGNOSIS — F0390 Unspecified dementia without behavioral disturbance: Secondary | ICD-10-CM | POA: Diagnosis not present

## 2023-01-15 DIAGNOSIS — J449 Chronic obstructive pulmonary disease, unspecified: Secondary | ICD-10-CM | POA: Diagnosis not present

## 2023-01-15 DIAGNOSIS — Z87891 Personal history of nicotine dependence: Secondary | ICD-10-CM | POA: Diagnosis not present

## 2023-01-15 DIAGNOSIS — R634 Abnormal weight loss: Secondary | ICD-10-CM | POA: Diagnosis not present

## 2023-01-15 DIAGNOSIS — R918 Other nonspecific abnormal finding of lung field: Secondary | ICD-10-CM | POA: Diagnosis not present

## 2023-01-15 DIAGNOSIS — M810 Age-related osteoporosis without current pathological fracture: Secondary | ICD-10-CM | POA: Diagnosis not present

## 2023-01-15 DIAGNOSIS — R251 Tremor, unspecified: Secondary | ICD-10-CM | POA: Diagnosis not present

## 2023-02-15 DIAGNOSIS — J449 Chronic obstructive pulmonary disease, unspecified: Secondary | ICD-10-CM | POA: Diagnosis not present

## 2023-02-15 DIAGNOSIS — R918 Other nonspecific abnormal finding of lung field: Secondary | ICD-10-CM | POA: Diagnosis not present

## 2023-02-15 DIAGNOSIS — Z2839 Other underimmunization status: Secondary | ICD-10-CM | POA: Diagnosis not present

## 2023-02-15 DIAGNOSIS — R0609 Other forms of dyspnea: Secondary | ICD-10-CM | POA: Diagnosis not present

## 2023-03-07 DIAGNOSIS — C342 Malignant neoplasm of middle lobe, bronchus or lung: Secondary | ICD-10-CM | POA: Diagnosis not present

## 2023-03-07 DIAGNOSIS — N6320 Unspecified lump in the left breast, unspecified quadrant: Secondary | ICD-10-CM | POA: Diagnosis not present

## 2023-03-07 DIAGNOSIS — M898X8 Other specified disorders of bone, other site: Secondary | ICD-10-CM | POA: Diagnosis not present

## 2023-03-13 DIAGNOSIS — Z87891 Personal history of nicotine dependence: Secondary | ICD-10-CM | POA: Diagnosis not present

## 2023-03-13 DIAGNOSIS — C342 Malignant neoplasm of middle lobe, bronchus or lung: Secondary | ICD-10-CM | POA: Diagnosis not present

## 2023-03-14 DIAGNOSIS — C342 Malignant neoplasm of middle lobe, bronchus or lung: Secondary | ICD-10-CM | POA: Diagnosis not present

## 2023-03-14 DIAGNOSIS — Z510 Encounter for antineoplastic radiation therapy: Secondary | ICD-10-CM | POA: Diagnosis not present

## 2023-03-14 DIAGNOSIS — Z87891 Personal history of nicotine dependence: Secondary | ICD-10-CM | POA: Diagnosis not present

## 2023-03-18 DIAGNOSIS — R10812 Left upper quadrant abdominal tenderness: Secondary | ICD-10-CM | POA: Diagnosis not present

## 2023-03-18 DIAGNOSIS — G8911 Acute pain due to trauma: Secondary | ICD-10-CM | POA: Diagnosis not present

## 2023-03-18 DIAGNOSIS — Z043 Encounter for examination and observation following other accident: Secondary | ICD-10-CM | POA: Diagnosis not present

## 2023-03-18 DIAGNOSIS — J432 Centrilobular emphysema: Secondary | ICD-10-CM | POA: Diagnosis not present

## 2023-03-18 DIAGNOSIS — R0789 Other chest pain: Secondary | ICD-10-CM | POA: Diagnosis not present

## 2023-03-18 DIAGNOSIS — W19XXXA Unspecified fall, initial encounter: Secondary | ICD-10-CM | POA: Diagnosis not present

## 2023-03-18 DIAGNOSIS — K5900 Constipation, unspecified: Secondary | ICD-10-CM | POA: Diagnosis not present

## 2023-03-22 DIAGNOSIS — C342 Malignant neoplasm of middle lobe, bronchus or lung: Secondary | ICD-10-CM | POA: Diagnosis not present

## 2023-03-22 DIAGNOSIS — Z87891 Personal history of nicotine dependence: Secondary | ICD-10-CM | POA: Diagnosis not present

## 2023-03-22 DIAGNOSIS — Z510 Encounter for antineoplastic radiation therapy: Secondary | ICD-10-CM | POA: Diagnosis not present

## 2023-03-28 DIAGNOSIS — Z510 Encounter for antineoplastic radiation therapy: Secondary | ICD-10-CM | POA: Diagnosis not present

## 2023-03-28 DIAGNOSIS — C342 Malignant neoplasm of middle lobe, bronchus or lung: Secondary | ICD-10-CM | POA: Diagnosis not present

## 2023-03-28 DIAGNOSIS — Z87891 Personal history of nicotine dependence: Secondary | ICD-10-CM | POA: Diagnosis not present

## 2023-03-29 DIAGNOSIS — Z510 Encounter for antineoplastic radiation therapy: Secondary | ICD-10-CM | POA: Diagnosis not present

## 2023-03-29 DIAGNOSIS — C342 Malignant neoplasm of middle lobe, bronchus or lung: Secondary | ICD-10-CM | POA: Diagnosis not present

## 2023-03-29 DIAGNOSIS — Z87891 Personal history of nicotine dependence: Secondary | ICD-10-CM | POA: Diagnosis not present

## 2023-03-30 DIAGNOSIS — Z87891 Personal history of nicotine dependence: Secondary | ICD-10-CM | POA: Diagnosis not present

## 2023-03-30 DIAGNOSIS — C342 Malignant neoplasm of middle lobe, bronchus or lung: Secondary | ICD-10-CM | POA: Diagnosis not present

## 2023-03-30 DIAGNOSIS — Z510 Encounter for antineoplastic radiation therapy: Secondary | ICD-10-CM | POA: Diagnosis not present

## 2023-04-02 DIAGNOSIS — Z510 Encounter for antineoplastic radiation therapy: Secondary | ICD-10-CM | POA: Diagnosis not present

## 2023-04-02 DIAGNOSIS — Z87891 Personal history of nicotine dependence: Secondary | ICD-10-CM | POA: Diagnosis not present

## 2023-04-02 DIAGNOSIS — C342 Malignant neoplasm of middle lobe, bronchus or lung: Secondary | ICD-10-CM | POA: Diagnosis not present

## 2023-04-03 DIAGNOSIS — C342 Malignant neoplasm of middle lobe, bronchus or lung: Secondary | ICD-10-CM | POA: Diagnosis not present

## 2023-04-03 DIAGNOSIS — Z87891 Personal history of nicotine dependence: Secondary | ICD-10-CM | POA: Diagnosis not present

## 2023-04-03 DIAGNOSIS — Z510 Encounter for antineoplastic radiation therapy: Secondary | ICD-10-CM | POA: Diagnosis not present

## 2023-04-04 DIAGNOSIS — C342 Malignant neoplasm of middle lobe, bronchus or lung: Secondary | ICD-10-CM | POA: Diagnosis not present

## 2023-04-04 DIAGNOSIS — Z87891 Personal history of nicotine dependence: Secondary | ICD-10-CM | POA: Diagnosis not present

## 2023-04-04 DIAGNOSIS — Z510 Encounter for antineoplastic radiation therapy: Secondary | ICD-10-CM | POA: Diagnosis not present

## 2023-04-05 DIAGNOSIS — Z87891 Personal history of nicotine dependence: Secondary | ICD-10-CM | POA: Diagnosis not present

## 2023-04-05 DIAGNOSIS — Z510 Encounter for antineoplastic radiation therapy: Secondary | ICD-10-CM | POA: Diagnosis not present

## 2023-04-05 DIAGNOSIS — C342 Malignant neoplasm of middle lobe, bronchus or lung: Secondary | ICD-10-CM | POA: Diagnosis not present

## 2023-04-06 DIAGNOSIS — C342 Malignant neoplasm of middle lobe, bronchus or lung: Secondary | ICD-10-CM | POA: Diagnosis not present

## 2023-04-06 DIAGNOSIS — Z87891 Personal history of nicotine dependence: Secondary | ICD-10-CM | POA: Diagnosis not present

## 2023-04-06 DIAGNOSIS — Z510 Encounter for antineoplastic radiation therapy: Secondary | ICD-10-CM | POA: Diagnosis not present

## 2023-04-09 DIAGNOSIS — Z510 Encounter for antineoplastic radiation therapy: Secondary | ICD-10-CM | POA: Diagnosis not present

## 2023-04-09 DIAGNOSIS — Z87891 Personal history of nicotine dependence: Secondary | ICD-10-CM | POA: Diagnosis not present

## 2023-04-09 DIAGNOSIS — C342 Malignant neoplasm of middle lobe, bronchus or lung: Secondary | ICD-10-CM | POA: Diagnosis not present

## 2023-04-10 DIAGNOSIS — Z510 Encounter for antineoplastic radiation therapy: Secondary | ICD-10-CM | POA: Diagnosis not present

## 2023-04-10 DIAGNOSIS — Z87891 Personal history of nicotine dependence: Secondary | ICD-10-CM | POA: Diagnosis not present

## 2023-04-10 DIAGNOSIS — C342 Malignant neoplasm of middle lobe, bronchus or lung: Secondary | ICD-10-CM | POA: Diagnosis not present

## 2023-04-11 DIAGNOSIS — Z510 Encounter for antineoplastic radiation therapy: Secondary | ICD-10-CM | POA: Diagnosis not present

## 2023-04-11 DIAGNOSIS — Z87891 Personal history of nicotine dependence: Secondary | ICD-10-CM | POA: Diagnosis not present

## 2023-04-11 DIAGNOSIS — C342 Malignant neoplasm of middle lobe, bronchus or lung: Secondary | ICD-10-CM | POA: Diagnosis not present

## 2023-04-12 DIAGNOSIS — L821 Other seborrheic keratosis: Secondary | ICD-10-CM | POA: Diagnosis not present

## 2023-04-12 DIAGNOSIS — D0439 Carcinoma in situ of skin of other parts of face: Secondary | ICD-10-CM | POA: Diagnosis not present

## 2023-04-12 DIAGNOSIS — Z87891 Personal history of nicotine dependence: Secondary | ICD-10-CM | POA: Diagnosis not present

## 2023-04-12 DIAGNOSIS — C342 Malignant neoplasm of middle lobe, bronchus or lung: Secondary | ICD-10-CM | POA: Diagnosis not present

## 2023-04-12 DIAGNOSIS — Z510 Encounter for antineoplastic radiation therapy: Secondary | ICD-10-CM | POA: Diagnosis not present

## 2023-04-13 DIAGNOSIS — Z87891 Personal history of nicotine dependence: Secondary | ICD-10-CM | POA: Diagnosis not present

## 2023-04-13 DIAGNOSIS — C342 Malignant neoplasm of middle lobe, bronchus or lung: Secondary | ICD-10-CM | POA: Diagnosis not present

## 2023-04-13 DIAGNOSIS — Z510 Encounter for antineoplastic radiation therapy: Secondary | ICD-10-CM | POA: Diagnosis not present

## 2023-04-16 DIAGNOSIS — Z87891 Personal history of nicotine dependence: Secondary | ICD-10-CM | POA: Diagnosis not present

## 2023-04-16 DIAGNOSIS — C342 Malignant neoplasm of middle lobe, bronchus or lung: Secondary | ICD-10-CM | POA: Diagnosis not present

## 2023-04-16 DIAGNOSIS — Z510 Encounter for antineoplastic radiation therapy: Secondary | ICD-10-CM | POA: Diagnosis not present

## 2023-04-17 DIAGNOSIS — E8809 Other disorders of plasma-protein metabolism, not elsewhere classified: Secondary | ICD-10-CM | POA: Diagnosis not present

## 2023-04-17 DIAGNOSIS — Z510 Encounter for antineoplastic radiation therapy: Secondary | ICD-10-CM | POA: Diagnosis not present

## 2023-04-17 DIAGNOSIS — C342 Malignant neoplasm of middle lobe, bronchus or lung: Secondary | ICD-10-CM | POA: Diagnosis not present

## 2023-04-17 DIAGNOSIS — F0390 Unspecified dementia without behavioral disturbance: Secondary | ICD-10-CM | POA: Diagnosis not present

## 2023-04-17 DIAGNOSIS — Z87891 Personal history of nicotine dependence: Secondary | ICD-10-CM | POA: Diagnosis not present

## 2023-04-18 ENCOUNTER — Other Ambulatory Visit: Payer: Self-pay | Admitting: Neurology

## 2023-04-20 ENCOUNTER — Encounter: Payer: Self-pay | Admitting: Neurology

## 2023-04-27 DIAGNOSIS — R32 Unspecified urinary incontinence: Secondary | ICD-10-CM | POA: Diagnosis not present

## 2023-04-27 DIAGNOSIS — L989 Disorder of the skin and subcutaneous tissue, unspecified: Secondary | ICD-10-CM | POA: Diagnosis not present

## 2023-04-27 DIAGNOSIS — R2681 Unsteadiness on feet: Secondary | ICD-10-CM | POA: Diagnosis not present

## 2023-04-27 DIAGNOSIS — N3000 Acute cystitis without hematuria: Secondary | ICD-10-CM | POA: Diagnosis not present

## 2023-04-27 DIAGNOSIS — R410 Disorientation, unspecified: Secondary | ICD-10-CM | POA: Diagnosis not present

## 2023-04-27 DIAGNOSIS — Z1330 Encounter for screening examination for mental health and behavioral disorders, unspecified: Secondary | ICD-10-CM | POA: Diagnosis not present

## 2023-04-30 ENCOUNTER — Encounter: Payer: Self-pay | Admitting: Neurology

## 2023-04-30 ENCOUNTER — Ambulatory Visit: Payer: Medicare HMO | Admitting: Neurology

## 2023-04-30 VITALS — BP 126/73 | HR 95 | Ht 64.0 in | Wt 89.4 lb

## 2023-04-30 DIAGNOSIS — G309 Alzheimer's disease, unspecified: Secondary | ICD-10-CM | POA: Diagnosis not present

## 2023-04-30 DIAGNOSIS — G20C Parkinsonism, unspecified: Secondary | ICD-10-CM

## 2023-04-30 DIAGNOSIS — R413 Other amnesia: Secondary | ICD-10-CM | POA: Diagnosis not present

## 2023-04-30 DIAGNOSIS — I739 Peripheral vascular disease, unspecified: Secondary | ICD-10-CM | POA: Diagnosis not present

## 2023-04-30 DIAGNOSIS — F0280 Dementia in other diseases classified elsewhere without behavioral disturbance: Secondary | ICD-10-CM

## 2023-04-30 DIAGNOSIS — R251 Tremor, unspecified: Secondary | ICD-10-CM

## 2023-04-30 DIAGNOSIS — L84 Corns and callosities: Secondary | ICD-10-CM | POA: Diagnosis not present

## 2023-04-30 DIAGNOSIS — L603 Nail dystrophy: Secondary | ICD-10-CM | POA: Diagnosis not present

## 2023-04-30 NOTE — Progress Notes (Signed)
 CC:  memory loss  Follow-up Visit  Last visit: 10/30/2022  78 year old female with a history of HTN, MGUS who follows in clinic for memory loss and tremor. Brain MRI 08/27/21 with mild generalized cerebral atrophy, slightly advanced for age.She lives in [place]. She lives with her son. Needs assistance for medication. They have a home health for 4 hours. They have started looking at facilities. She is on aricept and namenda at this point may not give any clinical benefit up to patient and son if wants to continue. No side effects. Negative for urinalysis. She is more fatigued. She has lost a lot of weight 2 years ago and lost weight in the past 3-6 weeks but stabilized. Eats well, they try to maintain fluid intake and boost. Sleeping well per patient, son says her nights and days are getting mixed up past 3-4 weeks. Tremor doesn;t bother her. She states she feels sad. She recognized her grandkids, vocalization has reduced, crafting stimulates her mind. Gave packet on information. Also may consider talking to Dr. Earnest Bailey about an SSRI.   Patient complains of symptoms per HPI as well as the following symptoms: none . Pertinent negatives and positives per HPI. All others negative   Brief HPI: 78 year old female with a history of HTN, MGUS who follows in clinic for memory loss and tremor. Brain MRI 08/27/21 with mild generalized cerebral atrophy, slightly advanced for age.She lives in [place]. She lives with her son. Needs assistance for medication. They have a home health for 4 hours. They have started looking at facilities. She is on aricept and namenda at this point may not give any clinical benefit up to patient and son if wants to continue. No side effects.   At her last visit she was started on donepezil for memory. Sinemet was started for tremor and rigidity.  Interval history 04/30/2023: She hs had a marked decline. Very little verbalization. 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   Interval History:10/30/2022 The patient's son states her ability to communicate has declined rapidly in the past 4 months. Most responses are "yes" or "no". She can't hold a conversation and struggles to follow directions. Can remember things remotely but needs a lot of prompting. She has been sleeping more lately as well. She is still living alone, but her brother comes and stays her in the afternoon. She does her own laundry but otherwise her sons assist with her ADLs. Appetite is good if meals are placed in front of her. She is not cooking herself. Her son will make sure she has dinner but is not always sure if she is eating breakfast or lunch. She has someone come over during the day to help with bathing. She is no longer driving. Sons help pay her bills and manage her medication. Continues to take donepezil without issues.  She continues to have tremors. Tried to take Sinemet but stopped because she struggled with the dosing schedule. She is less steady but has not had any falls. Saw PT and is getting a walker. Denies visual hallucinations.  She underwent neuropsychological testing 09/07/22 which was most consistent with Alzheimer's disease, though Lewy Body and Parkinson's could not be ruled out.   Physical Exam:   Vital Signs: BP 126/73 (BP Location: Right Arm, Patient Position: Sitting, Cuff Size: Normal)   Pulse 95   Ht 5\' 4"  (1.626 m)   Wt 89 lb 6.4 oz (40.6 kg)  BMI 15.35 kg/m    Physical exam: Exam: Gen: NAD, conversant      CV: No palpitations or chest pain or SOB. VS: Breathing at a normal rate. Weight appears frail. Not febrile. Eyes: Conjunctivae clear without exudates or hemorrhage  Neuro: Detailed Neurologic Exam  Speech: One-word answers Cognition:    04/30/2023   11:12 AM  MMSE - Mini Mental State Exam  Orientation to time 0  Orientation to Place 3  Registration 3  Attention/ Calculation 0  Recall 1  Language- name 2 objects  2  Language- repeat 0  Language- follow 3 step command 0  Language- read & follow direction 1  Write a sentence 0  Copy design 0  Total score 10     Cranial Nerves: Hypophonic    The pupils are equal, round, and reactive to light. Visual fields are full to threat Extraocular movements are intact.  The face is symmetric with normal sensation. The palate elevates in the midline. Hearing intact. Voice is normal. Shoulder shrug is normal. The tongue has normal motion without fasciculations.   Coordination: bradykinetic  Gait: Using a walker, decreased stride, dcreased arm swing on left  Motor Observation: Resting and postural tremor noted in limbs and head  Tone:    normal  Posture:    Slightly stooped    Strength:    Strength is anti-gravity and symmetric in the upper and lower limbs.      Sensation: intact to LT, no reports of numbness or tingling or paresthesias       IMPRESSION: 78 year old female with a history of HTN and MGUS who presents for follow up of memory loss and tremor. Neuropsychological testing was most consistent with Alzheimer's disease, though Lewy Body and Parkinson's could not be ruled out. Her functioning continues to decline. Will continue donepezil and Namenda. Continue Sinemet for tremor son states it may be helping with BID dosing and see if this is easier for her to manage.  -  She lives with her son. Needs assistance for medication. They have a home health for 4 hours. They have started looking at facilities. Discussed memory units - She is on aricept and namenda at this point may not give any clinical benefit up to patient and son if wants to continue. No side effects. Son wishes to continue - Son wishes to continue sinemet, may be helping tremor - Gave packet on [employer] information.  - Also may consider talking to Dr. Earnest Bailey about an SSRI.  - Has f/u with Dr. Kieth Brightly in May  PLAN: -Continue donepezil 10 mg daily -continue Namenda  10 mg BID -continue sinemet 1 pill bid - discussed can have pcp fill medications, at this point quality of life is most important. Son would like to continue following with [place]. NP follow up 6 months and then may discuss again discharging back to pcp.   Follow-up: 6 months  I spent 30 minutes of face-to-face and non-face-to-face time with patient on the  1. Alzheimer disease (HCC)   2. Parkinsonism, unspecified Parkinsonism type (HCC)   3. Tremor   4. Memory loss    diagnosis.  This included previsit chart review, lab review, study review, order entry, electronic health record documentation, patient education on the different diagnostic and therapeutic options, counseling and coordination of care, risks and benefits of management, compliance, or risk factor reduction  Naomie Dean MD

## 2023-05-10 DIAGNOSIS — M810 Age-related osteoporosis without current pathological fracture: Secondary | ICD-10-CM | POA: Diagnosis not present

## 2023-05-10 DIAGNOSIS — R2681 Unsteadiness on feet: Secondary | ICD-10-CM | POA: Diagnosis not present

## 2023-05-11 DIAGNOSIS — J3489 Other specified disorders of nose and nasal sinuses: Secondary | ICD-10-CM | POA: Diagnosis not present

## 2023-05-11 DIAGNOSIS — C342 Malignant neoplasm of middle lobe, bronchus or lung: Secondary | ICD-10-CM | POA: Diagnosis not present

## 2023-05-11 DIAGNOSIS — R2681 Unsteadiness on feet: Secondary | ICD-10-CM | POA: Diagnosis not present

## 2023-05-11 DIAGNOSIS — I10 Essential (primary) hypertension: Secondary | ICD-10-CM | POA: Diagnosis not present

## 2023-05-11 DIAGNOSIS — F0390 Unspecified dementia without behavioral disturbance: Secondary | ICD-10-CM | POA: Diagnosis not present

## 2023-05-11 DIAGNOSIS — R5383 Other fatigue: Secondary | ICD-10-CM | POA: Diagnosis not present

## 2023-05-11 DIAGNOSIS — R634 Abnormal weight loss: Secondary | ICD-10-CM | POA: Diagnosis not present

## 2023-05-24 DIAGNOSIS — C44329 Squamous cell carcinoma of skin of other parts of face: Secondary | ICD-10-CM | POA: Diagnosis not present

## 2023-05-24 DIAGNOSIS — L821 Other seborrheic keratosis: Secondary | ICD-10-CM | POA: Diagnosis not present

## 2023-05-25 DIAGNOSIS — C44329 Squamous cell carcinoma of skin of other parts of face: Secondary | ICD-10-CM | POA: Diagnosis not present

## 2023-06-09 DIAGNOSIS — M810 Age-related osteoporosis without current pathological fracture: Secondary | ICD-10-CM | POA: Diagnosis not present

## 2023-06-09 DIAGNOSIS — R2681 Unsteadiness on feet: Secondary | ICD-10-CM | POA: Diagnosis not present

## 2023-06-15 ENCOUNTER — Telehealth: Payer: Self-pay | Admitting: Neurology

## 2023-06-15 MED ORDER — DONEPEZIL HCL 10 MG PO TABS: 10.0000 mg | ORAL_TABLET | Freq: Every day | ORAL | 6 refills | Status: DC

## 2023-06-15 NOTE — Telephone Encounter (Signed)
 Cld Pt to make her aware donepezil  refill had been sent. Caregiver answered stated Pt was uavailable. Asked CG to make Pt aware [facility] had called back. CG voiced understanding.

## 2023-06-15 NOTE — Telephone Encounter (Signed)
Pt is requesting a refill for  donepezil (ARICEPT) 10 MG tablet.  Pharmacy:  [facility] [phone]

## 2023-06-18 ENCOUNTER — Encounter: Payer: Medicare HMO | Attending: Psychology | Admitting: Psychology

## 2023-06-18 DIAGNOSIS — F0390 Unspecified dementia without behavioral disturbance: Secondary | ICD-10-CM | POA: Insufficient documentation

## 2023-06-18 DIAGNOSIS — R251 Tremor, unspecified: Secondary | ICD-10-CM | POA: Diagnosis not present

## 2023-06-18 DIAGNOSIS — R413 Other amnesia: Secondary | ICD-10-CM | POA: Diagnosis not present

## 2023-06-18 DIAGNOSIS — F801 Expressive language disorder: Secondary | ICD-10-CM | POA: Insufficient documentation

## 2023-06-21 DIAGNOSIS — F0390 Unspecified dementia without behavioral disturbance: Secondary | ICD-10-CM | POA: Diagnosis not present

## 2023-06-21 DIAGNOSIS — Z681 Body mass index (BMI) 19 or less, adult: Secondary | ICD-10-CM | POA: Diagnosis not present

## 2023-06-21 DIAGNOSIS — R64 Cachexia: Secondary | ICD-10-CM | POA: Diagnosis not present

## 2023-06-21 DIAGNOSIS — M47812 Spondylosis without myelopathy or radiculopathy, cervical region: Secondary | ICD-10-CM | POA: Diagnosis not present

## 2023-06-21 DIAGNOSIS — Z043 Encounter for examination and observation following other accident: Secondary | ICD-10-CM | POA: Diagnosis not present

## 2023-06-21 DIAGNOSIS — R112 Nausea with vomiting, unspecified: Secondary | ICD-10-CM | POA: Diagnosis not present

## 2023-06-21 DIAGNOSIS — R58 Hemorrhage, not elsewhere classified: Secondary | ICD-10-CM | POA: Diagnosis not present

## 2023-06-21 DIAGNOSIS — R911 Solitary pulmonary nodule: Secondary | ICD-10-CM | POA: Diagnosis not present

## 2023-06-21 DIAGNOSIS — I959 Hypotension, unspecified: Secondary | ICD-10-CM | POA: Diagnosis not present

## 2023-06-21 DIAGNOSIS — Z23 Encounter for immunization: Secondary | ICD-10-CM | POA: Diagnosis not present

## 2023-06-21 DIAGNOSIS — R9431 Abnormal electrocardiogram [ECG] [EKG]: Secondary | ICD-10-CM | POA: Diagnosis not present

## 2023-06-21 DIAGNOSIS — Z87891 Personal history of nicotine dependence: Secondary | ICD-10-CM | POA: Diagnosis not present

## 2023-06-21 DIAGNOSIS — I10 Essential (primary) hypertension: Secondary | ICD-10-CM | POA: Diagnosis not present

## 2023-06-21 DIAGNOSIS — S0101XA Laceration without foreign body of scalp, initial encounter: Secondary | ICD-10-CM | POA: Diagnosis not present

## 2023-06-21 DIAGNOSIS — S0990XA Unspecified injury of head, initial encounter: Secondary | ICD-10-CM | POA: Diagnosis not present

## 2023-06-21 DIAGNOSIS — S0181XA Laceration without foreign body of other part of head, initial encounter: Secondary | ICD-10-CM | POA: Diagnosis not present

## 2023-06-21 DIAGNOSIS — Z79899 Other long term (current) drug therapy: Secondary | ICD-10-CM | POA: Diagnosis not present

## 2023-06-21 DIAGNOSIS — Z9183 Wandering in diseases classified elsewhere: Secondary | ICD-10-CM | POA: Diagnosis not present

## 2023-06-21 DIAGNOSIS — Y92008 Other place in unspecified non-institutional (private) residence as the place of occurrence of the external cause: Secondary | ICD-10-CM | POA: Diagnosis not present

## 2023-06-21 DIAGNOSIS — W19XXXA Unspecified fall, initial encounter: Secondary | ICD-10-CM | POA: Diagnosis not present

## 2023-06-21 NOTE — Progress Notes (Unsigned)
 Neuropsychological Evaluation   Patient:  Brynlei Klausner   DOB: 11-Mar-1945  MR Number: [id number]  Location: [facility] [place] [facility] [address] [place], [address] [place] [address] Dept: [phone]  Start: 2 PM End: 3 PM  Provider/Observer:     Marrion Sjogren PsyD  Chief Complaint:      Chief Complaint  Patient presents with   Memory Loss   Tremors   Other    Expressive language change    06/18/2023 2PM-3PM:  Today was a follow-up visit for assessment for possible repeat neuropsychological assessment.  Patient cam in with one of her sons to assistant with clinical information.  Patient indicated that she really did not feel much change has taken place over the past year, but her son noted continued decline and that the patient's expressive language has really declined to the point that she has little to no volume to her speech and usually one give indications of yes/no answers.  I was not able to adequately hear or understand when she was trying to say more.  Her son reports that she was sometimes use two or more words but you almost have to read her lips.  Her voice is very very faint.  Patient continues with Aricept /Namenda  per Neurology.  Son notes steady decline over past year.  Patient was found to have a spot on Lung identified as cancer and had had radiation treatment.  Patient has deterrierated to the point that one son is there every evening and every other day with CNA 8-4 every day.  The patient has decliened to the point that repeat testing is not going to be helpful and patient has severe progressive cognitive decline.    Reason For Service:      Solash Tullo is a 78 year old female referred by her primary care provider Maximo Spar, MD for neuropsychological evaluation due to concerns of cognitive functioning, including changes in speech and expressive language functions, memory  issues with well-maintained long-term memory but more difficulty remembering names of family members and very recent events.  Patient has a past medical history including osteoporosis and has used Fosamax as well as hypertension.  Patient has had a significant pneumonia issue with acute respiratory failure in 20 12/2010 and was in the hospital for 10 days.  Issues were first noted approximately 2 or 3 years ago but was very minor in nature and it continued to progress on a steady progressive change pattern.  There have been a number of psychosocial changes including the death of a couple of friends over the past year or 2 and the patient is more isolated due to various reasons over the past couple of years.   The patient has also been seen by her treating neurologist Dala Dublin, MD on 08/17/2021 because of the same issues related to memory loss.  During the visit with her neurologist memory changes were noted over the past couple of months and the patient described noticing word finding difficulties and trouble putting sentences together.  Son notes that patient's sentences have become shorter and she will not have as extensive conversations as before.  Patient also noted that she tended to forget conversations and will repeat herself often.  Patient has had MMSE of 17/30 on recent PCP visit.  Was also noted that the patient developed a tremor in the past couple of months and was noticed in both arms and her head.  Also  reported that her mother had similar tremor.  This has not impacted walking or balance issues.  There was noted family history of dementia/cognitive change with the patient's mother.  At the time of the neurological evaluation, the patient had a MoCA that was 13/30 suggestive of dementia/cognitive loss.  Was also noted that memory has impacted her ADLs though she was still able to function relatively independent.  Dr. Billy Bue felt it was still early to determine whether her tremors represented  essential tremor or early Parkinson's but that these tremors did not currently exhibit other signs of parkinsonian type condition.   Patient had MRI brain conducted on 08/27/2021 that showed no acute process or abnormality.  There was mild degree of generalized cerebral atrophy noted but no other abnormalities identified.   During the clinical interview today, the patient's son reports that he for started noticing changes, which were rather minor in nature approximately 2 to 3 years ago and has noticed a slow but steady progression over time.  Both the patient's and her son acknowledged that the patient demonstrates trouble remembering names of others and has issues with effective communication noting word finding difficulties, retrieval/finding words to say and short-term memory difficulties.  The patient is noted to remember long-term/distant information and is remembering her kids and grandkids names/birthdays but has more trouble remembering them as quickly as she used to.  They both note tremor in her hand that is increasing shaking over the past couple of months.  The patient is described not having any geographic disorientation or indications of changes in visual spatial or visual constructional abilities.  The patient is still driving short distances but is not going on longer trips.  There have been no motor vehicle accident or difficulties.  The patient is reported to have lost 10 pounds over the past year but reports that she does have an appetite if food is prepared for her.  She is still drinking adequate amount of water.  She also has a boost supplemental drink every day.   The patient's son reports that there is a significant change in motivation and activity/energy levels and that the patient is not doing as much as she did before.  There have been a couple of friends passed away over the past year or 2 and the patient is not walking with her neighbors or having regular communications like she  did and is more isolated.  She has 1 adult child living in [place] but there is not a lot of interaction with her family on a day-to-day basis.   Patient reports that she tends to wake up a lot at night but can go back to sleep and will sleep between 10 PM and 7 AM and feels rested in the morning.  Tests Administered: Controlled Oral Word Association Test Grooved Pegboard Repeatable Battery for the Assessment of Neuropsychological Status (RBANS); Form D  Participation Level:   Active  Participation Quality:  Appropriate      Behavioral Observation:  The patient appeared well-groomed and appropriately dressed. Her manners were polite and appropriate to the situation. The patient's attitude toward testing was positive and she showed good effort. Well Groomed, Alert, and Appropriate.   Test Results:   Initially, an estimation was made as to the patient's premorbid intellectual and cognitive abilities.  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  We will use a conservative estimate of her premorbid intellectual and cognitive abilities to have  been in the high average range relative to a normative population roughly performing 1 standard deviation above baseline normative data.   COWAT FAS total = 7 Z = -2.89 Animals = 3 Z = -3.62     The patient was administered the control or word association test which assesses both lexical fluency and semantic fluency elements.  The patient showed significant profound deficits on both of these expressive language measures performing between 2.89 and 3.62 standard deviations below age, gender and education matched comparison group.  This is a significantly impaired score and consistent with descriptions of increasing difficulties with word finding capacity.  She showed deficits for lexical fluency and semantic targeted naming capacity.     Grooved Pegboard Right hand (dominant) Time = 3:38.79 0  drops 21 rule violations Left hand  Time = 3:13.50 3 drops 3 rule violations  The patient was also administered the grooved pegboard test to look for both fine motor control issues as well as look for potential signs of lateralization.  The patient performed very poorly for both right dominant hand and left nondominant hand with great difficulty inhibiting the impulse to grab the shaped keys with both hands to assist her self.  She had a number of drops with her nondominant hand and required a significant time to complete this task for both hands.  However, there was no indication of any lateralization of deficits although her right dominant hand showed greater impairments than left dominant hand they were both significantly impaired.  The patient has had noted tremor and neurology exams including cogwheeling and resting tremor noted.  The patient was also administered the repeatable battery for the assessment of neuropsychological status (RBANS-D) to allow for the possibility need for progressive assessment.  The patient has also been administered serial MoCA testing through neurology and has shown progressive decline in her performance over time having scores on previous testing of 17/30, 13/30 and most recently 11/30 performances on MoCA testing with progressive decline over time.  The patient's performance on the RBANS was generally consistent with findings on previous MoCA testing.  The patient showed significant deficits for global cognitive functioning performing in the borderline range relative to a normative comparison group.  This pattern suggests significant cognitive decline from premorbid intellectual and cognitive capacities.  The patient showed only mild to moderate weaknesses with regard to attentional components including effectively making attaining information in her auditory register and significant slowing for overall information processing speed and focus execute abilities.  The  patient also performed in the extremely low range relative to immediate and delayed memory functions.  Borderline/significantly impaired functions were noted for both immediate and delayed memory without significant improvement under acute recall formats.  The patient showed significant difficulty with visual-spatial and visual constructional capacity although some of this was noted due to be affected by her significant tremor and poor fine motor control.  The patient's lexical and semantic verbal fluency were significantly impaired relative to the normative population as well consistent with the patient and family reports of changes in expressive language capacity.  These difficulties were for both lexical and semantic fluency capacity.  Under delayed memory measures the patient showed significant loss of information over a period of delay without significant improvements under acute/recognition format.  Impression/Diagnosis:   The results of the current neuropsychological evaluation do show consistency between available medical records, subjective reports by the patient and her family as well as objective neuropsychological measures.  The patient is showing significant expressive language  changes for both lexical and semantic fluency reduction, increasing executive functioning deficits and significant visual spatial and visual constructional deficits.  While her immediate auditory encoding capacity (maintaining information in her active auditory register) was only mildly impaired the patient displayed significant deficits in her ability to store, organize and retrieve new information.  There were significant weaknesses for visual spatial and visual constructional abilities, focus execute abilities, executive functioning, expressive language functions and significant fine motor control deficits noted.  The patient appears to have been progressing with these difficulties over the past 2 to 3 years with noted  tremor.  There are no descriptions of auditory or visual hallucinations noted but there have been some adjustment issues and potentially issues with depressive symptomatology.  Specific diagnostic considerations are still difficult to be determined due to the nature as the patient is showing significant changes in cognition that are greater than her motor tremors that she has displayed.  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  We will likely need follow-up testing/repeat testing to provide a somewhat more definitive explanation.  This does not appear to be a cerebrovascular condition per se with the patient showing cognitive decline for the most part across the board with particular language and memory changes.  Differential diagnoses does continue to remain Alzheimer's versus Lewy body versus Parkinson's conditions and this does appear to be a progressive neurocognitive disorder.  I will sit down with the patient and her family and go over the results of the current neuropsychological evaluation with specific recommendations.  The patient has been followed by Dr. Billy Bue for neurology workup with Dr. Milus Alpha still considering the possibility of a Parkinson's-like condition and the patient has been prescribed Aricept  as well as Sinemet  for her tremor/motor changes.  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 Clinical Neuropsychologist

## 2023-07-02 DIAGNOSIS — G20C Parkinsonism, unspecified: Secondary | ICD-10-CM | POA: Diagnosis not present

## 2023-07-02 DIAGNOSIS — F0390 Unspecified dementia without behavioral disturbance: Secondary | ICD-10-CM | POA: Diagnosis not present

## 2023-07-02 DIAGNOSIS — R600 Localized edema: Secondary | ICD-10-CM | POA: Diagnosis not present

## 2023-07-02 DIAGNOSIS — J3489 Other specified disorders of nose and nasal sinuses: Secondary | ICD-10-CM | POA: Diagnosis not present

## 2023-07-02 DIAGNOSIS — I10 Essential (primary) hypertension: Secondary | ICD-10-CM | POA: Diagnosis not present

## 2023-07-02 DIAGNOSIS — R634 Abnormal weight loss: Secondary | ICD-10-CM | POA: Diagnosis not present

## 2023-07-02 DIAGNOSIS — C342 Malignant neoplasm of middle lobe, bronchus or lung: Secondary | ICD-10-CM | POA: Diagnosis not present

## 2023-07-10 DIAGNOSIS — R2681 Unsteadiness on feet: Secondary | ICD-10-CM | POA: Diagnosis not present

## 2023-07-10 DIAGNOSIS — M810 Age-related osteoporosis without current pathological fracture: Secondary | ICD-10-CM | POA: Diagnosis not present

## 2023-07-17 DIAGNOSIS — R918 Other nonspecific abnormal finding of lung field: Secondary | ICD-10-CM | POA: Diagnosis not present

## 2023-07-17 DIAGNOSIS — C342 Malignant neoplasm of middle lobe, bronchus or lung: Secondary | ICD-10-CM | POA: Diagnosis not present

## 2023-07-23 DIAGNOSIS — Z87891 Personal history of nicotine dependence: Secondary | ICD-10-CM | POA: Diagnosis not present

## 2023-07-23 DIAGNOSIS — Z85828 Personal history of other malignant neoplasm of skin: Secondary | ICD-10-CM | POA: Diagnosis not present

## 2023-07-23 DIAGNOSIS — C342 Malignant neoplasm of middle lobe, bronchus or lung: Secondary | ICD-10-CM | POA: Diagnosis not present

## 2023-07-23 DIAGNOSIS — Z08 Encounter for follow-up examination after completed treatment for malignant neoplasm: Secondary | ICD-10-CM | POA: Diagnosis not present

## 2023-07-31 DIAGNOSIS — R918 Other nonspecific abnormal finding of lung field: Secondary | ICD-10-CM | POA: Diagnosis not present

## 2023-07-31 DIAGNOSIS — C342 Malignant neoplasm of middle lobe, bronchus or lung: Secondary | ICD-10-CM | POA: Diagnosis not present

## 2023-07-31 DIAGNOSIS — R590 Localized enlarged lymph nodes: Secondary | ICD-10-CM | POA: Diagnosis not present

## 2023-08-07 DIAGNOSIS — C342 Malignant neoplasm of middle lobe, bronchus or lung: Secondary | ICD-10-CM | POA: Diagnosis not present

## 2023-08-07 DIAGNOSIS — Z87891 Personal history of nicotine dependence: Secondary | ICD-10-CM | POA: Diagnosis not present

## 2023-08-07 DIAGNOSIS — C3411 Malignant neoplasm of upper lobe, right bronchus or lung: Secondary | ICD-10-CM | POA: Diagnosis not present

## 2023-08-09 DIAGNOSIS — I10 Essential (primary) hypertension: Secondary | ICD-10-CM | POA: Diagnosis not present

## 2023-08-09 DIAGNOSIS — R2681 Unsteadiness on feet: Secondary | ICD-10-CM | POA: Diagnosis not present

## 2023-08-09 DIAGNOSIS — R634 Abnormal weight loss: Secondary | ICD-10-CM | POA: Diagnosis not present

## 2023-08-09 DIAGNOSIS — D472 Monoclonal gammopathy: Secondary | ICD-10-CM | POA: Diagnosis not present

## 2023-08-09 DIAGNOSIS — C342 Malignant neoplasm of middle lobe, bronchus or lung: Secondary | ICD-10-CM | POA: Diagnosis not present

## 2023-08-09 DIAGNOSIS — F0390 Unspecified dementia without behavioral disturbance: Secondary | ICD-10-CM | POA: Diagnosis not present

## 2023-08-09 DIAGNOSIS — M810 Age-related osteoporosis without current pathological fracture: Secondary | ICD-10-CM | POA: Diagnosis not present

## 2023-08-09 NOTE — Progress Notes (Signed)
 Subjective   Chief Complaint:   Lisa Mcknight is a 78 y.o. female  who returns for follow up of lung cancer  History of Present Illness: Chaperone offered and declined Today, I had the pleasure of meeting the patient and her son to discuss her progressive lung cancer is noted by PET CT scan 07/31/2023.  I personally visualized the images and shared them with the patient and her son.  The patient has significant dementia and is not able to speak coherently although I do think she understands much of what is said.  She has seen Dr. Ora who has said that he could radiate the areas of progression in her lung but that he would not be able to address what appears to be lymph node involvement.  She underwent primary radiation therapy in February and that lesion is smaller and less radioactive but she now has 2 new lesions as well as apparent lymph node involvement suggesting little to no improvement after radiation therapy.  Dr. Ora has recommended immunotherapy and that is certainly a consideration but in someone with dementia who requires 24-hour a day care and cannot do her own activities of daily living I wonder about the risk-benefit ratio.  I am concerned that she may develop significant toxicity that we will shorten her life span in make her quality of life poor but I have told the patient's son and her that I am more than happy to place a Port-A-Cath and try immunotherapy if that would be their desire I have asked them to think about our discussion and discuss it with the other children and contact me either by phone or MyChart and let me know their decision process.  My enthusiasm for radiation therapy is less than it was initially after I realized that she apparently developed progressive disease after only 5 months from completion of her radiation.  I would also be a lot more enthusiastic if the patient had a higher performance status.  Her blood pressure today is 120/58 and her weight is 90 pounds with  a BMI of 15 having lost 2 pounds since she was last seen by a physician.  Oncology History  Malignant neoplasm of middle lobe of right lung (*)  09/07/2022 Initial Diagnosis   Malignant neoplasm of middle lobe of right lung (*)   09/25/2022 Imaging   PET: Peripheral right middle lobe nodule series 4 image 100 measures 16.5 x 11.7 mm with an SUV Max of 4.5.   10/24/2022 Biopsy   Right lung, middle lobe, needle core biopsy and touch prep: Non-small cell carcinoma, favor moderately differentiated adenocarcinoma.   10/31/2022 Cancer Staged   Staging form: Lung, AJCC 8th Edition - Clinical stage from 10/31/2022: Stage IA3 (cT1c, cN0, cM0) - Signed by Romana RAYMOND Quay, MD on 10/31/2022    03/07/2023 Imaging   PET:  Increase in right middle lobe lesion on image 99 measuring 36 x 21 mm compared to 15 x 12 mm. This now involve the right lateral fifth rib and chest wall. SUV max is 8.7.   03/13/2023 Cancer Staged   Clinical Stage IIB (untreated) Staging form: Lung, AJCC 8th Edition - Clinical stage from 03/13/2023: Stage IIB (cT3, cN0, cM0) - Signed by Hobart LELON Plaza, MD on 03/13/2023    03/28/2023 - 04/17/2023 Radiation Oncology   Treated to a dose of 60 Gy in 15 fractions to the right middle lobe lung.   07/17/2023 Imaging   CT chest:    IMPRESSION: 1.  Findings of progressive disease with development of right hilar lymphadenopathy and a right upper lobe lesion, likely metastasis. 2.  Development of a compression deformity of T7 and heterogeneous appearance of T4 concerning for potential osseous metastatic disease. Areas of sclerosis of multiple anterior ribs which may reflect lesions or healing fractures. 3.  Multiple additional smaller pulmonary nodules scattered throughout the lungs which are nonspecific. 4.  Persistent areas of tree-in-bud nodularity within the lungs, greatest of the right lower lobe and likely infectious/territory.     Cancer Staging <redacted file path>  Malignant  neoplasm of middle lobe of right lung (*) Staging form: Lung, AJCC 8th Edition - Clinical stage from 10/31/2022: Stage IA3 (cT1c, cN0, cM0) - Signed by Romana RAYMOND Quay, MD on 10/31/2022 - Clinical stage from 03/13/2023: Stage IIB (cT3, cN0, cM0) - Signed by Hobart LELON Plaza, MD on 03/13/2023   Allergies, Social History, and Family History were reviewed and updated.    Review of Systems:  All systems were reviewed and are negative except as noted in HPI.  Objective   Physical Exam:  Vital Signs: BP (!) 120/58 (BP Location: Left Upper Arm, Patient Position: Sitting)   Pulse 99   Temp 98.5 F (36.9 C) (Temporal)   Resp 16   Ht 5' 5 (1.651 m)   Wt 90 lb 6.4 oz (41 kg)   SpO2 97%   Breastfeeding No   BMI 15.04 kg/m Pain Score: 0-No pain Pain Intervention: Not indicated Falls: None Oral drug compliance: Not applicable Distress score: Unknown Intent of chemotherapy: Not applicable  ECOG:  (3) Capable of limited self-care, confined to bed or chair > 50% of waking hours Constitutional: The patient is in no apparent distress, alert oriented, able to speak in full sentences, and cachectic Eyes: Conjunctivas and lids are pink and moist.  Pupils are equal and normally reactive to light.  Extraocular movements are intact. No scleral icterus.  Neurologic: Awake and alert and attempts to speak when spoken to     Accessory Clinical Data:  All pertinent labs/imaging reviewed and discussed with patient.  No results found for this or any previous visit (from the past 24 hours).  Impression   Encounter Diagnoses  Code Name Primary?  . I10 Hypertension, unspecified type   . R63.4 Weight loss   . M81.0 Osteoporosis, unspecified osteoporosis type, unspecified pathological fracture presence   . E83.52 Hypercalcemia   . 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 C34.2 Malignant neoplasm of middle lobe of right lung (*)   . F03.90 Dementia without behavioral disturbance,  psychotic disturbance, mood disturbance, or anxiety, unspecified dementia severity, unspecified dementia type (*)     Plan  1. Hypertension, unspecified type 2. Weight loss 3. Osteoporosis, unspecified osteoporosis type, unspecified pathological fracture presence 4. Hypercalcemia 5. MGUS (monoclonal gammopathy of unknown significance) 6. Malignant neoplasm of middle lobe of right lung (*) 7. Dementia without behavioral disturbance, psychotic disturbance, mood disturbance, or anxiety, unspecified dementia severity, unspecified dementia type (*) .  Patient's blood pressure is adequately controlled with goal blood pressure less than 130/80 to prevent heart attack and stroke.  She continues to lose weight despite supplements.  She has osteoporosis and is wheelchair-bound and is at risk for osteoporotic fracture.  She has a history of hypercalcemia associated with her malignancy with her last calcium level at 9.8 on 03/18/2023.  She has a history of monoclonal gammopathy of uncertain significance and progressive right sided lung cancer along with dementia.  I  think her best option may be hospice and best supportive care rather than immunotherapy which I have discussed with the patient and her son and I look forward to hearing back from them regarding their decision process of what they would like to do next.  She has very poor vascular access and will require a Port-A-Cath.  Patient voiced understanding and is in agreement with the plan.  All questions were discussed.   Rudell MALVA Bud, MD 08/09/2023 / 5:54 PM

## 2023-09-09 DIAGNOSIS — R2681 Unsteadiness on feet: Secondary | ICD-10-CM | POA: Diagnosis not present

## 2023-09-09 DIAGNOSIS — M810 Age-related osteoporosis without current pathological fracture: Secondary | ICD-10-CM | POA: Diagnosis not present

## 2023-10-08 DIAGNOSIS — R634 Abnormal weight loss: Secondary | ICD-10-CM | POA: Diagnosis not present

## 2023-10-08 DIAGNOSIS — I10 Essential (primary) hypertension: Secondary | ICD-10-CM | POA: Diagnosis not present

## 2023-10-08 DIAGNOSIS — D472 Monoclonal gammopathy: Secondary | ICD-10-CM | POA: Diagnosis not present

## 2023-10-08 DIAGNOSIS — R0600 Dyspnea, unspecified: Secondary | ICD-10-CM | POA: Diagnosis not present

## 2023-10-08 DIAGNOSIS — F0390 Unspecified dementia without behavioral disturbance: Secondary | ICD-10-CM | POA: Diagnosis not present

## 2023-10-08 DIAGNOSIS — C342 Malignant neoplasm of middle lobe, bronchus or lung: Secondary | ICD-10-CM | POA: Diagnosis not present

## 2023-10-08 DIAGNOSIS — J3489 Other specified disorders of nose and nasal sinuses: Secondary | ICD-10-CM | POA: Diagnosis not present

## 2023-10-08 DIAGNOSIS — Z23 Encounter for immunization: Secondary | ICD-10-CM | POA: Diagnosis not present

## 2023-10-08 NOTE — Progress Notes (Signed)
 Subjective   HPI:  Lisa Mcknight is a 78 y.o.  female who presents to the office with:  Chief Complaint  Patient presents with  . Follow-up    3 month follow up. Patient's son Redell is present with her today. He does report that he's noticed that she's had some congestion within the last week   Stable dyspnea.  No fever, chills.  Minimal cough  ROS: Negative for constitutional, CV, Resp, Abd/GU symptoms except as noted above in HPI.  PMH: Past medical history, Past surgical history, Social history, family history were reviewed as noted in EMR.  Pertinent for:  Past Medical History:  Diagnosis Date  . COPD (chronic obstructive pulmonary disease) (*)   . Dementia (*)   . Hypertension   . Lung cancer (*)   . Osteoporosis      Medications and allergies reviewed.   Objective    Physical Exam:  Vital Signs: BP 129/78 (BP Location: Left Upper Arm, Patient Position: Sitting)   Pulse 101   Temp 98 F (36.7 C) (Temporal)   Resp 20   Ht 5' 5 (1.651 m)   Wt 89 lb 6.4 oz (40.6 kg)   SpO2 97%   BMI 14.88 kg/m   General:  Well appearing, Alert & Oriented CV:  Normal to palpation without thrill.  Regular Rate and Rhythm, No murmurs, rubs, or gallops.   Respiratory:  Normal respiratory effort.  No wheezes or crackles.  Good air movement without diminished sounds.   Abdomen:  Normal bowel sounds.  Non-tender to palpation.  No rebound or guarding.  No palpable liver or spleen. Extremities:  No pretibial edema  Assessment/Plan    Orders Placed This Encounter  Procedures  . Fluad Trivalent Adjuvanted (age 15 and older/solid organ transplant recipients age 45-64)  . Ambulatory referral to ENT    Rhinorrhea Continued left sided rhinorrhea with exertion and eating, impacting quality of life.  Not responsive to flonase + ipratropium nasal sprays. Will refer to ENT to discuss with family if any non-invasive interventions would be recommended   Weight loss   Stable  Hypertension Doing well on hald dose of HCTZ and amlodipine    Malignant neoplasm of middle lobe of right lung  (*) Reviewed last OV with oncology- family has chosen not to pursue further treatment at this time due to poor functional status, risk vs benefit.  They will review at their upcoming oncology visit.  Discussed with son, palliative care referral placed.     Dyspnea Stable, will monitor.     Flu vaccine administered- rec covid and RSV vaccines  Future Appointments  Date Time Provider Department Center  10/16/2023 11:15 AM Romana RAYMOND Quay, MD [facility]  12/24/2023 10:30 AM Luke MARLA Manns, MD [facility] None    Medications at end of visit today: Current Medications[1]        [1]  Current Outpatient Medications:  .  albuterol  sulfate HFA (VENTOLIN  HFA) 108 (90 Base) MCG/ACT inhaler, Inhale one puff into the lungs every 6 (six) hours as needed for Wheezing., Disp: 18 g, Rfl: 0 .  alendronate (FOSAMAX) 70 mg tablet, Take one tablet (70 mg dose) by mouth every 7 (seven) days., Disp: 12 tablet, Rfl: 3 .  amLODIPine  besylate (NORVASC ) 5 mg tablet, Take one tablet (5 mg dose) by mouth daily., Disp: 90 tablet, Rfl: 3 .  carbidopa -levodopa  (SINEMET ) 25-100 mg per tablet, Take one tablet by mouth 3 (three) times a day., Disp: , Rfl:  .  donepezil  HCl (ARICEPT ) 10 mg tablet, Take one tablet (10 mg dose) by mouth 1 (one) time each day with breakfast., Disp: , Rfl:  .  hydroCHLOROthiazide  25 mg tablet, Take one half tablet (12.5 mg dose) by mouth every morning., Disp: 90 tablet, Rfl: 1 .  ipratropium (ATROVENT ) 0.03% nasal spray, two sprays by Nasal route 3 (three) times a day., Disp: 30 mL, Rfl: 2 .  memantine  HCl (NAMENDA ) 10 mg tablet, Take one tablet (10 mg dose) by mouth daily., Disp: , Rfl:  .  Misc. Devices MISC, Rolling walker, Disp: 1 each, Rfl: 0 .  Misc. Devices MISC, Incontinence supplies: briefs and underpads, Disp: 1 each, Rfl: 0 .  Misc. Devices MISC,  Wheelchair, Disp: 1 each, Rfl: 0 .  mupirocin (BACTROBAN) 2 % ointment, Apply topically., Disp: , Rfl:  .  sertraline (ZOLOFT) 25 mg tablet, TAKE 1 TABLET(25 MG) BY MOUTH DAILY, Disp: 90 tablet, Rfl: 1

## 2023-10-10 DIAGNOSIS — R2681 Unsteadiness on feet: Secondary | ICD-10-CM | POA: Diagnosis not present

## 2023-10-10 DIAGNOSIS — M810 Age-related osteoporosis without current pathological fracture: Secondary | ICD-10-CM | POA: Diagnosis not present

## 2023-10-23 DIAGNOSIS — E43 Unspecified severe protein-calorie malnutrition: Secondary | ICD-10-CM | POA: Diagnosis not present

## 2023-10-23 DIAGNOSIS — C3491 Malignant neoplasm of unspecified part of right bronchus or lung: Secondary | ICD-10-CM | POA: Diagnosis not present

## 2023-10-23 DIAGNOSIS — D472 Monoclonal gammopathy: Secondary | ICD-10-CM | POA: Diagnosis not present

## 2023-10-23 DIAGNOSIS — F0390 Unspecified dementia without behavioral disturbance: Secondary | ICD-10-CM | POA: Diagnosis not present

## 2023-10-23 DIAGNOSIS — R634 Abnormal weight loss: Secondary | ICD-10-CM | POA: Diagnosis not present

## 2023-10-23 DIAGNOSIS — C342 Malignant neoplasm of middle lobe, bronchus or lung: Secondary | ICD-10-CM | POA: Diagnosis not present

## 2023-10-23 DIAGNOSIS — I10 Essential (primary) hypertension: Secondary | ICD-10-CM | POA: Diagnosis not present

## 2023-11-09 DIAGNOSIS — R2681 Unsteadiness on feet: Secondary | ICD-10-CM | POA: Diagnosis not present

## 2023-11-09 DIAGNOSIS — M810 Age-related osteoporosis without current pathological fracture: Secondary | ICD-10-CM | POA: Diagnosis not present

## 2023-12-10 DIAGNOSIS — M810 Age-related osteoporosis without current pathological fracture: Secondary | ICD-10-CM | POA: Diagnosis not present

## 2023-12-10 DIAGNOSIS — R2681 Unsteadiness on feet: Secondary | ICD-10-CM | POA: Diagnosis not present

## 2023-12-12 ENCOUNTER — Other Ambulatory Visit: Payer: Self-pay

## 2023-12-12 MED ORDER — DONEPEZIL HCL 10 MG PO TABS: 10.0000 mg | ORAL_TABLET | Freq: Every day | ORAL | 0 refills | Status: DC

## 2023-12-12 NOTE — Telephone Encounter (Signed)
 Last filled by patient : 12/03/23 Last office visit : 04/30/23 with Dr. Ines  Next office visit : patient cancelled11/13/25 with Megan M.  and did not reshedule   - MyChart message has been sent that patient needs to reschedule an office visit for further refills.

## 2023-12-13 ENCOUNTER — Ambulatory Visit: Admitting: Adult Health

## 2023-12-23 ENCOUNTER — Other Ambulatory Visit: Payer: Self-pay | Admitting: Adult Health

## 2023-12-24 DIAGNOSIS — F0390 Unspecified dementia without behavioral disturbance: Secondary | ICD-10-CM | POA: Diagnosis not present

## 2023-12-24 DIAGNOSIS — C342 Malignant neoplasm of middle lobe, bronchus or lung: Secondary | ICD-10-CM | POA: Diagnosis not present

## 2023-12-24 DIAGNOSIS — Z515 Encounter for palliative care: Secondary | ICD-10-CM | POA: Diagnosis not present

## 2023-12-24 DIAGNOSIS — R059 Cough, unspecified: Secondary | ICD-10-CM | POA: Diagnosis not present

## 2023-12-24 DIAGNOSIS — I10 Essential (primary) hypertension: Secondary | ICD-10-CM | POA: Diagnosis not present

## 2023-12-24 DIAGNOSIS — J3489 Other specified disorders of nose and nasal sinuses: Secondary | ICD-10-CM | POA: Diagnosis not present

## 2023-12-24 DIAGNOSIS — Z0000 Encounter for general adult medical examination without abnormal findings: Secondary | ICD-10-CM | POA: Diagnosis not present

## 2024-01-09 DIAGNOSIS — R2681 Unsteadiness on feet: Secondary | ICD-10-CM | POA: Diagnosis not present

## 2024-01-09 DIAGNOSIS — M810 Age-related osteoporosis without current pathological fracture: Secondary | ICD-10-CM | POA: Diagnosis not present

## 2024-01-19 ENCOUNTER — Emergency Department (HOSPITAL_COMMUNITY)
Admission: EM | Admit: 2024-01-19 | Discharge: 2024-01-19 | Disposition: A | Discharge status: Home / Self Care | Attending: Emergency Medicine | Admitting: Emergency Medicine

## 2024-01-19 ENCOUNTER — Emergency Department (HOSPITAL_COMMUNITY)

## 2024-01-19 ENCOUNTER — Other Ambulatory Visit: Payer: Self-pay

## 2024-01-19 ENCOUNTER — Encounter (HOSPITAL_COMMUNITY): Payer: Self-pay | Admitting: *Deleted

## 2024-01-19 DIAGNOSIS — C3491 Malignant neoplasm of unspecified part of right bronchus or lung: Secondary | ICD-10-CM | POA: Diagnosis not present

## 2024-01-19 DIAGNOSIS — R0602 Shortness of breath: Secondary | ICD-10-CM | POA: Diagnosis not present

## 2024-01-19 DIAGNOSIS — F0390 Unspecified dementia without behavioral disturbance: Secondary | ICD-10-CM | POA: Insufficient documentation

## 2024-01-19 DIAGNOSIS — C3490 Malignant neoplasm of unspecified part of unspecified bronchus or lung: Secondary | ICD-10-CM | POA: Diagnosis not present

## 2024-01-19 DIAGNOSIS — R918 Other nonspecific abnormal finding of lung field: Secondary | ICD-10-CM | POA: Diagnosis not present

## 2024-01-19 DIAGNOSIS — J690 Pneumonitis due to inhalation of food and vomit: Secondary | ICD-10-CM | POA: Insufficient documentation

## 2024-01-19 DIAGNOSIS — R059 Cough, unspecified: Secondary | ICD-10-CM | POA: Diagnosis not present

## 2024-01-19 DIAGNOSIS — J189 Pneumonia, unspecified organism: Secondary | ICD-10-CM | POA: Diagnosis not present

## 2024-01-19 DIAGNOSIS — R000 Tachycardia, unspecified: Secondary | ICD-10-CM | POA: Diagnosis not present

## 2024-01-19 LAB — RESP PANEL BY RT-PCR (RSV, FLU A&B, COVID)  RVPGX2
Influenza A by PCR: NEGATIVE
Influenza B by PCR: NEGATIVE
Resp Syncytial Virus by PCR: NEGATIVE
SARS Coronavirus 2 by RT PCR: NEGATIVE

## 2024-01-19 LAB — CBC WITH DIFFERENTIAL/PLATELET
Abs Immature Granulocytes: 0.03 K/uL (ref 0.00–0.07)
Basophils Absolute: 0.1 K/uL (ref 0.0–0.1)
Basophils Relative: 1 %
Eosinophils Absolute: 0.3 K/uL (ref 0.0–0.5)
Eosinophils Relative: 3 %
HCT: 43 % (ref 36.0–46.0)
Hemoglobin: 13.5 g/dL (ref 12.0–15.0)
Immature Granulocytes: 0 %
Lymphocytes Relative: 8 %
Lymphs Abs: 0.9 K/uL (ref 0.7–4.0)
MCH: 29.2 pg (ref 26.0–34.0)
MCHC: 31.4 g/dL (ref 30.0–36.0)
MCV: 92.9 fL (ref 80.0–100.0)
Monocytes Absolute: 1 K/uL (ref 0.1–1.0)
Monocytes Relative: 9 %
Neutro Abs: 9.3 K/uL — ABNORMAL HIGH (ref 1.7–7.7)
Neutrophils Relative %: 79 %
Platelets: 341 K/uL (ref 150–400)
RBC: 4.63 MIL/uL (ref 3.87–5.11)
RDW: 13.6 % (ref 11.5–15.5)
WBC: 11.6 K/uL — ABNORMAL HIGH (ref 4.0–10.5)
nRBC: 0 % (ref 0.0–0.2)

## 2024-01-19 LAB — BASIC METABOLIC PANEL WITH GFR
Anion gap: 12 (ref 5–15)
BUN: 20 mg/dL (ref 8–23)
CO2: 25 mmol/L (ref 22–32)
Calcium: 10 mg/dL (ref 8.9–10.3)
Chloride: 104 mmol/L (ref 98–111)
Creatinine, Ser: 0.6 mg/dL (ref 0.44–1.00)
GFR, Estimated: >60 mL/min
Glucose, Bld: 93 mg/dL (ref 70–99)
Potassium: 3.7 mmol/L (ref 3.5–5.1)
Sodium: 141 mmol/L (ref 135–145)

## 2024-01-19 MED ORDER — IOHEXOL 350 MG/ML SOLN
75.0000 mL | Freq: Once | INTRAVENOUS | Status: AC | PRN
  Administered 2024-01-19: 75 mL via INTRAVENOUS

## 2024-01-19 MED ORDER — DOXYCYCLINE HYCLATE 100 MG PO CAPS: 100.0000 mg | ORAL_CAPSULE | Freq: Two times a day (BID) | ORAL | 0 refills | Status: AC

## 2024-01-19 MED ORDER — DOXYCYCLINE HYCLATE 100 MG PO TABS
100.0000 mg | ORAL_TABLET | Freq: Once | ORAL | Status: AC
  Administered 2024-01-19: 100 mg via ORAL
  Filled 2024-01-19: qty 1

## 2024-01-19 MED ORDER — AMOXICILLIN-POT CLAVULANATE 875-125 MG PO TABS
1.0000 | ORAL_TABLET | Freq: Once | ORAL | Status: AC
  Administered 2024-01-19: 1 via ORAL
  Filled 2024-01-19: qty 1

## 2024-01-19 MED ORDER — AMOXICILLIN-POT CLAVULANATE 875-125 MG PO TABS: 1.0000 | ORAL_TABLET | Freq: Two times a day (BID) | ORAL | 0 refills | Status: DC

## 2024-01-19 NOTE — ED Triage Notes (Signed)
 The family mmeber with her reports that she usually has thicken  liquids and soft food.  At present no coughing or noise with her breathing  she is non-verbal

## 2024-01-19 NOTE — ED Notes (Signed)
 Patient Lisa Mcknight noted to be 89%, patient placed on 2L [place] at this time.

## 2024-01-19 NOTE — ED Provider Notes (Signed)
 " [place] EMERGENCY DEPARTMENT AT [facility] Provider Note   CSN: [id number] Arrival date & time: 01/19/24  1701     Patient presents with: Cough and Aspiration   Lisa Mcknight is a 78 y.o. female.  {Add pertinent medical, surgical, social history, OB history to HPI:5930} 78 year old female with advanced dementia who is nonverbal and has dysphagia with COPD and lung cancer who presents emergency department cough.  History obtained per the patient's son.  They report that due to the patient's dementia she now has to have food pured or cut very small pieces before eating.  She had lunch and then 15 minutes later started having cough.  Also sounded very congested.  No fevers or chills.  Does have a chronic cough but this is worse than usual.  They called her primary doctor and decided to bring her into the emergency department.  Has not yet started on hospice and lives at home with her sons       Prior to Admission medications  Medication Sig Start Date End Date Taking? Authorizing Provider  amLODipine  (NORVASC ) 2.5 MG tablet Take 5 mg by mouth daily. 07/06/21   [provider]  carbidopa -levodopa  (SINEMET  IR) 25-100 MG tablet Take half a pill twice daily (8 AM and noon) for one week, then one pill twice daily 10/30/22   Rush Nest, MD  donepezil  (ARICEPT ) 10 MG tablet TAKE 1 TABLET(10 MG) BY MOUTH DAILY WITH BREAKFAST 01/03/24   Penumalli, Vikram R, MD  hydrochlorothiazide  (HYDRODIURIL ) 25 MG tablet Take 25 mg by mouth daily.    [provider]  memantine  (NAMENDA ) 10 MG tablet TAKE 1 TABLET(10 MG) BY MOUTH TWICE DAILY 04/19/23   Ines Onetha NOVAK, MD  Multiple Vitamins-Minerals (MULTIVITAMINS THER. W/MINERALS) TABS Take 1 tablet by mouth daily.      [provider]    Allergies: Patient has no known allergies.    Review of Systems  Updated Vital Signs BP (!) 120/98   Pulse 100   Temp 97.9 F (36.6 C)   Resp 15   Ht 5' 4 (1.626 m)   Wt  40.6 kg   SpO2 97%   BMI 15.36 kg/m   Physical Exam Vitals reviewed: ***.     (all labs ordered are listed, but only abnormal results are displayed) Labs Reviewed  RESP PANEL BY RT-PCR (RSV, FLU A&B, COVID)  RVPGX2  BASIC METABOLIC PANEL WITH GFR  CBC WITH DIFFERENTIAL/PLATELET    EKG: EKG Interpretation Date/Time:  Saturday January 19 2024 17:43:46 EST Ventricular Rate:  110 PR Interval:  156 QRS Duration:  106 QT Interval:  358 QTC Calculation: 484 R Axis:   32  Text Interpretation: Sinus tachycardia with occasional Premature ventricular complexes and Fusion complexes Possible Left atrial enlargement Minimal voltage criteria for LVH, may be normal variant ( Cornell product ) Cannot rule out Anterior infarct , age undetermined Abnormal ECG When compared with ECG of 09-Jun-2017 12:13, PREVIOUS ECG IS PRESENT Confirmed by Yolande Charleston [phone]) on 01/19/2024 6:06:10 PM  Radiology: No results found.  {Document cardiac monitor, telemetry assessment procedure when appropriate:32947} Procedures   Medications Ordered in the ED - No data to display    {Click here for ABCD2, HEART and other calculators REFRESH Note before signing:1}                              Medical Decision Making  ***  {Document critical care time  when appropriate  Document review of labs and clinical decision tools ie CHADS2VASC2, etc  Document your independent review of radiology images and any outside records  Document your discussion with family members, caretakers and with consultants  Document social determinants of health affecting pt's care  Document your decision making why or why not admission, treatments were needed:32947:::1}   Final diagnoses:  None    ED Discharge Orders     None        "

## 2024-01-19 NOTE — Discharge Instructions (Signed)
 You were seen for your pneumonia and worsening cancer in the emergency department.   At home, please take the antibiotics we have prescribed you.    Check your MyChart online for the results of any tests that had not resulted by the time you left the emergency department.   Follow-up with your primary doctor in 2-3 days regarding your visit.    Return immediately to the emergency department if you experience any of the following: Difficulty breathing, high fevers, or any other concerning symptoms.    Thank you for visiting our Emergency Department. It was a pleasure taking care of you today.

## 2024-01-19 NOTE — ED Provider Triage Note (Signed)
 Emergency Medicine Provider Triage Evaluation Note  Adrian Specht , a 78 y.o. female  was evaluated in triage.  Pt complains of cough. Per son, pt has had a recurrent cough for the past week. After lunch today she's cough a lot more with rattling sounds in her lungs.  Son worries she may have aspirated on her food.  Also had hx of lung CA.  No report of fever  Review of Systems  Positive: As above Negative: As above  Physical Exam  BP (!) 120/98   Pulse 100   Temp 97.9 F (36.6 C)   Resp 15   SpO2 97%  Gen:   Awake, no distress   Resp:  Normal effort  MSK:   Moves extremities without difficulty  Other:    Medical Decision Making  Medically screening exam initiated at 5:23 PM.  Appropriate orders placed.  Ahriyah Vannest was informed that the remainder of the evaluation will be completed by another provider, this initial triage assessment does not replace that evaluation, and the importance of remaining in the ED until their evaluation is complete.  Found to by hypoxic, supplemental O2 started   Nivia Colon, PA-C 01/19/24 1725

## 2024-01-19 NOTE — ED Triage Notes (Signed)
 Patient BIB family for concerns about aspiration. Patient began coughing after eating chicken nuggets for lunch. Patient has recently had to transition to soft/ small foods and she continues to have difficulty with even those foods.

## 2024-01-19 NOTE — Telephone Encounter (Signed)
 Elderly non verbal pt who started coughing and has a rattle sound to her breathing. Coughing episodes started right after eating, concern for aspiration. Son to take pt to ED now Reason for Disposition  Coughing or other airway symptoms return  Protocols used: Choking - Inhaled Foreign Body-A-AH

## 2024-01-25 ENCOUNTER — Inpatient Hospital Stay (HOSPITAL_COMMUNITY)
Admission: EM | Admit: 2024-01-25 | Discharge: 2024-01-31 | DRG: 871 | Disposition: E | Attending: Pulmonary Disease | Admitting: Pulmonary Disease

## 2024-01-25 ENCOUNTER — Emergency Department (HOSPITAL_COMMUNITY)

## 2024-01-25 DIAGNOSIS — J440 Chronic obstructive pulmonary disease with acute lower respiratory infection: Secondary | ICD-10-CM | POA: Diagnosis present

## 2024-01-25 DIAGNOSIS — E079 Disorder of thyroid, unspecified: Secondary | ICD-10-CM | POA: Diagnosis present

## 2024-01-25 DIAGNOSIS — M810 Age-related osteoporosis without current pathological fracture: Secondary | ICD-10-CM | POA: Diagnosis present

## 2024-01-25 DIAGNOSIS — R6520 Severe sepsis without septic shock: Secondary | ICD-10-CM | POA: Diagnosis present

## 2024-01-25 DIAGNOSIS — J690 Pneumonitis due to inhalation of food and vomit: Principal | ICD-10-CM | POA: Diagnosis present

## 2024-01-25 DIAGNOSIS — J9602 Acute respiratory failure with hypercapnia: Secondary | ICD-10-CM | POA: Diagnosis present

## 2024-01-25 DIAGNOSIS — J189 Pneumonia, unspecified organism: Secondary | ICD-10-CM | POA: Diagnosis present

## 2024-01-25 DIAGNOSIS — Z1152 Encounter for screening for COVID-19: Secondary | ICD-10-CM

## 2024-01-25 DIAGNOSIS — I10 Essential (primary) hypertension: Secondary | ICD-10-CM | POA: Diagnosis present

## 2024-01-25 DIAGNOSIS — A419 Sepsis, unspecified organism: Principal | ICD-10-CM | POA: Diagnosis present

## 2024-01-25 DIAGNOSIS — C799 Secondary malignant neoplasm of unspecified site: Secondary | ICD-10-CM | POA: Diagnosis present

## 2024-01-25 DIAGNOSIS — C3490 Malignant neoplasm of unspecified part of unspecified bronchus or lung: Secondary | ICD-10-CM | POA: Diagnosis present

## 2024-01-25 DIAGNOSIS — R092 Respiratory arrest: Secondary | ICD-10-CM

## 2024-01-25 DIAGNOSIS — Z604 Social exclusion and rejection: Secondary | ICD-10-CM | POA: Diagnosis present

## 2024-01-25 DIAGNOSIS — Z66 Do not resuscitate: Secondary | ICD-10-CM | POA: Diagnosis present

## 2024-01-25 DIAGNOSIS — Z8249 Family history of ischemic heart disease and other diseases of the circulatory system: Secondary | ICD-10-CM

## 2024-01-25 DIAGNOSIS — Z79899 Other long term (current) drug therapy: Secondary | ICD-10-CM

## 2024-01-25 DIAGNOSIS — Z515 Encounter for palliative care: Secondary | ICD-10-CM

## 2024-01-25 DIAGNOSIS — R1310 Dysphagia, unspecified: Secondary | ICD-10-CM | POA: Diagnosis not present

## 2024-01-25 DIAGNOSIS — J9601 Acute respiratory failure with hypoxia: Secondary | ICD-10-CM | POA: Diagnosis present

## 2024-01-25 DIAGNOSIS — E041 Nontoxic single thyroid nodule: Secondary | ICD-10-CM | POA: Diagnosis not present

## 2024-01-25 DIAGNOSIS — F0390 Unspecified dementia without behavioral disturbance: Secondary | ICD-10-CM | POA: Diagnosis present

## 2024-01-25 DIAGNOSIS — E162 Hypoglycemia, unspecified: Secondary | ICD-10-CM | POA: Diagnosis not present

## 2024-01-25 DIAGNOSIS — C342 Malignant neoplasm of middle lobe, bronchus or lung: Secondary | ICD-10-CM | POA: Diagnosis not present

## 2024-01-25 DIAGNOSIS — Z87891 Personal history of nicotine dependence: Secondary | ICD-10-CM

## 2024-01-25 LAB — CBC WITH DIFFERENTIAL/PLATELET
Abs Immature Granulocytes: 0.2 K/uL — ABNORMAL HIGH (ref 0.00–0.07)
Basophils Absolute: 0.1 K/uL (ref 0.0–0.1)
Basophils Relative: 0 %
Eosinophils Absolute: 0.6 K/uL — ABNORMAL HIGH (ref 0.0–0.5)
Eosinophils Relative: 3 %
HCT: 43.2 % (ref 36.0–46.0)
Hemoglobin: 13.2 g/dL (ref 12.0–15.0)
Immature Granulocytes: 1 %
Lymphocytes Relative: 21 %
Lymphs Abs: 5.1 K/uL — ABNORMAL HIGH (ref 0.7–4.0)
MCH: 28.7 pg (ref 26.0–34.0)
MCHC: 30.6 g/dL (ref 30.0–36.0)
MCV: 93.9 fL (ref 80.0–100.0)
Monocytes Absolute: 1.8 K/uL — ABNORMAL HIGH (ref 0.1–1.0)
Monocytes Relative: 8 %
Neutro Abs: 16.3 K/uL — ABNORMAL HIGH (ref 1.7–7.7)
Neutrophils Relative %: 67 %
Platelets: 403 K/uL — ABNORMAL HIGH (ref 150–400)
RBC: 4.6 MIL/uL (ref 3.87–5.11)
RDW: 13.6 % (ref 11.5–15.5)
WBC: 24.1 K/uL — ABNORMAL HIGH (ref 4.0–10.5)
nRBC: 0 % (ref 0.0–0.2)

## 2024-01-25 LAB — BLOOD GAS, ARTERIAL
Acid-Base Excess: 0.1 mmol/L (ref 0.0–2.0)
Bicarbonate: 30 mmol/L — ABNORMAL HIGH (ref 20.0–28.0)
Drawn by: 23532
FIO2: 1 %
MECHVT: 400 mL
O2 Saturation: 100 %
PEEP: 5 cmH2O
Patient temperature: 37
RATE: 18 {breaths}/min
pCO2 arterial: 75 mmHg (ref 32–48)
pH, Arterial: 7.21 — ABNORMAL LOW (ref 7.35–7.45)
pO2, Arterial: 230 mmHg — ABNORMAL HIGH (ref 83–108)

## 2024-01-25 LAB — COMPREHENSIVE METABOLIC PANEL WITH GFR
ALT: 38 U/L (ref 0–44)
AST: 32 U/L (ref 15–41)
Albumin: 4 g/dL (ref 3.5–5.0)
Alkaline Phosphatase: 185 U/L — ABNORMAL HIGH (ref 38–126)
Anion gap: 10 (ref 5–15)
BUN: 18 mg/dL (ref 8–23)
CO2: 30 mmol/L (ref 22–32)
Calcium: 10.1 mg/dL (ref 8.9–10.3)
Chloride: 105 mmol/L (ref 98–111)
Creatinine, Ser: 0.67 mg/dL (ref 0.44–1.00)
GFR, Estimated: >60 mL/min
Glucose, Bld: 233 mg/dL — ABNORMAL HIGH (ref 70–99)
Potassium: 3.5 mmol/L (ref 3.5–5.1)
Sodium: 145 mmol/L (ref 135–145)
Total Bilirubin: 0.2 mg/dL (ref 0.0–1.2)
Total Protein: 7.8 g/dL (ref 6.5–8.1)

## 2024-01-25 LAB — I-STAT CG4 LACTIC ACID, ED: Lactic Acid, Venous: 2 mmol/L (ref 0.5–1.9)

## 2024-01-25 LAB — RESP PANEL BY RT-PCR (RSV, FLU A&B, COVID)  RVPGX2
Influenza A by PCR: NEGATIVE
Influenza B by PCR: NEGATIVE
Resp Syncytial Virus by PCR: NEGATIVE
SARS Coronavirus 2 by RT PCR: NEGATIVE

## 2024-01-25 LAB — CBG MONITORING, ED: Glucose-Capillary: 219 mg/dL — ABNORMAL HIGH (ref 70–99)

## 2024-01-25 MED ORDER — FENTANYL CITRATE (PF) 50 MCG/ML IJ SOSY
PREFILLED_SYRINGE | INTRAMUSCULAR | Status: AC
  Administered 2024-01-25: 50 ug
  Filled 2024-01-25: qty 2

## 2024-01-25 MED ORDER — MIDAZOLAM HCL (PF) 2 MG/2ML IJ SOLN
1.0000 mg | INTRAMUSCULAR | Status: DC | PRN
  Filled 2024-01-25: qty 2

## 2024-01-25 MED ORDER — FENTANYL CITRATE (PF) 50 MCG/ML IJ SOSY
50.0000 ug | PREFILLED_SYRINGE | INTRAMUSCULAR | Status: DC | PRN
  Administered 2024-01-25: 100 ug via INTRAVENOUS
  Administered 2024-01-25: 50 ug via INTRAVENOUS
  Filled 2024-01-25 (×2): qty 2

## 2024-01-25 MED ORDER — MIDAZOLAM HCL 2 MG/2ML IJ SOLN
INTRAMUSCULAR | Status: AC
  Administered 2024-01-25: 2 mg
  Filled 2024-01-25: qty 2

## 2024-01-25 MED ORDER — ETOMIDATE 2 MG/ML IV SOLN
INTRAVENOUS | Status: AC
  Filled 2024-01-25: qty 20

## 2024-01-25 MED ORDER — LACTATED RINGERS IV SOLN: INTRAVENOUS | Status: DC

## 2024-01-25 MED ORDER — ETOMIDATE 2 MG/ML IV SOLN
INTRAVENOUS | Status: AC
  Administered 2024-01-25: 30 mg
  Filled 2024-01-25: qty 20

## 2024-01-25 MED ORDER — LACTATED RINGERS IV BOLUS (SEPSIS)
1000.0000 mL | Freq: Once | INTRAVENOUS | Status: AC
  Administered 2024-01-25: 1000 mL via INTRAVENOUS

## 2024-01-25 MED ORDER — FENTANYL CITRATE (PF) 50 MCG/ML IJ SOSY
50.0000 ug | PREFILLED_SYRINGE | INTRAMUSCULAR | Status: DC | PRN
  Administered 2024-01-25 – 2024-01-26 (×2): 50 ug via INTRAVENOUS
  Filled 2024-01-25: qty 1

## 2024-01-25 MED ORDER — SODIUM CHLORIDE 0.9 % IV SOLN
2.0000 g | Freq: Once | INTRAVENOUS | Status: AC
  Administered 2024-01-25: 2 g via INTRAVENOUS
  Filled 2024-01-25: qty 12.5

## 2024-01-25 MED ORDER — ROCURONIUM BROMIDE 10 MG/ML (PF) SYRINGE
PREFILLED_SYRINGE | INTRAVENOUS | Status: AC
  Filled 2024-01-25: qty 10

## 2024-01-25 MED ORDER — SUCCINYLCHOLINE CHLORIDE 200 MG/10ML IV SOSY
PREFILLED_SYRINGE | INTRAVENOUS | Status: AC
  Filled 2024-01-25: qty 10

## 2024-01-25 NOTE — ED Triage Notes (Signed)
 Pt came via EMS w/ c/o of respiratory distress. Pt seen Saturday at ED for aspiration pneumonia. D/C w/ antibiotics. Upon EMS arrival PT was tripoding w/ low O2 on RA. EMS started bagging pt due to decompensation.   EMS 2 duonebs .3mg  EPI IM 2g mag

## 2024-01-25 NOTE — Progress Notes (Signed)
 Subjective   HPI:  Lisa Mcknight is a 78 y.o.  female who presents to the office with:  Chief Complaint  Patient presents with   Ed Discharge    Patient accompanied by son. Recent ED visit. Updated medication list. 7 days. Concerns of hard time swallowing. Onset last week. Using thickener.   Patient presents for ER follow up. She is accompanied by her son. Son states that patient is nonverbal and therefore he provides HPI. ER documentation, imaging, and labs 01/19/24 reviewed. Son reports medication compliance with Augmentin  and Doxycycline . He has not noticed patient exhibiting any shortness of breath or difficulty/labored breathing. No fevers, chills. He voices concerns of dysphagia and states home health recommended speech pathology evaluation. Patient is currently able to tolerate soft foods and thickened liquids. He does not believe patient experiences any pain with swallowing, though states this is difficult to tell since she is nonverbal.   ROS: Negative for constitutional, CV, Resp, Abd/GU symptoms except as noted above in HPI.  PMH: Past medical history, Past surgical history, Social history, family history were reviewed as noted in EMR.  Pertinent for:  Past Medical History:  Diagnosis Date   COPD (chronic obstructive pulmonary disease) (*)    Dementia (*)    Hypertension    Lung cancer (*)    Osteoporosis    Thyroid nodule 01/25/2024     Medications and allergies reviewed.   Objective    Physical Exam:  Vital Signs: BP 126/68 (BP Location: Left Upper Arm, Patient Position: Sitting)   Pulse 103   Temp 97.3 F (36.3 C) (Temporal)   Resp 16   SpO2 94%   General:  Well appearing, Alert & Oriented. Sitting in wheelchair. HEENT:  Oropharynx pink and moist. Airway patent. Patient tolerating secretions. CV:  Normal to palpation without thrill.  Regular Rate and Rhythm, No murmurs, rubs, or gallops.   Respiratory:  Normal respiratory effort.  No wheezes or  crackles.  Good air movement on room air. Abdomen:  Normal bowel sounds.  Non-tender to palpation.  No rebound or guarding.  No palpable liver or spleen.  Assessment/Plan   1. Dysphagia, unspecified type (Primary) Comments: Continue soft diet and thickened liquids. Speech referral placed for evaluation and recommendations. Orders: -     Ambulatory referral to Speech Therapy Evaluation and Treatment; Future 2. Thyroid nodule Assessment & Plan: Obtain thyroid US  for further evaluation.  Orders: -     US  Thyroid; Future 3. Aspiration pneumonia, unspecified aspiration pneumonia type, unspecified laterality, unspecified part of lung (*) Comments: Improving - finish antibiotics as prescribed. Speech path ref placed for further eval of dysphagia. 4. Malignant neoplasm of middle lobe of right lung (*) Assessment & Plan: Continue management through oncology.      No follow-ups on file.  No future appointments.  Medications at end of visit today: Current Medications[1]       [1]  Current Outpatient Medications:    albuterol  sulfate HFA (VENTOLIN  HFA) 108 (90 Base) MCG/ACT inhaler, Inhale one puff into the lungs every 6 (six) hours as needed for Wheezing. (Patient not taking: Reported on 12/24/2023), Disp: 18 g, Rfl: 0   alendronate (FOSAMAX) 70 mg tablet, Take one tablet (70 mg dose) by mouth every 7 (seven) days., Disp: 12 tablet, Rfl: 3   amLODIPine  besylate (NORVASC ) 5 mg tablet, Take one tablet (5 mg dose) by mouth daily., Disp: 90 tablet, Rfl: 3   amoxicillin -clavulanate (AUGMENTIN ) 875-125 mg per tablet, SMARTSIG:1 Tablet(s) By Mouth Every  12 Hours, Disp: , Rfl:    benzonatate (TESSALON PERLES) 100 mg capsule, Take one capsule (100 mg dose) by mouth 3 (three) times a day as needed for Cough., Disp: 60 capsule, Rfl: 0   carbidopa -levodopa  (SINEMET ) 25-100 mg per tablet, Take one tablet by mouth 3 (three) times a day., Disp: , Rfl:    donepezil  HCl (ARICEPT ) 10 mg  tablet, Take one tablet (10 mg dose) by mouth 1 (one) time each day with breakfast., Disp: , Rfl:    doxycycline  hyclate (VIBRAMYCIN ) 100 mg capsule, Take one capsule (100 mg dose) by mouth 2 (two) times daily., Disp: , Rfl:    hydroCHLOROthiazide  25 mg tablet, Take one half tablet (12.5 mg dose) by mouth every morning., Disp: 90 tablet, Rfl: 1   ipratropium (ATROVENT ) 0.03% nasal spray, two sprays by Nasal route 3 (three) times a day. (Patient not taking: Reported on 12/24/2023), Disp: 30 mL, Rfl: 2   memantine  HCl (NAMENDA ) 10 mg tablet, Take one tablet (10 mg dose) by mouth daily., Disp: , Rfl:    Misc. Devices MISC, Rolling walker, Disp: 1 each, Rfl: 0   Misc. Devices MISC, Incontinence supplies: briefs and underpads, Disp: 1 each, Rfl: 0   Misc. Devices MISC, Wheelchair, Disp: 1 each, Rfl: 0   mupirocin (BACTROBAN) 2 % ointment, Apply topically., Disp: , Rfl:    omeprazole (PRILOSEC) 20 mg capsule, Take one capsule (20 mg dose) by mouth daily as needed., Disp: 90 capsule, Rfl: 0   sertraline (ZOLOFT) 25 mg tablet, TAKE 1 TABLET(25 MG) BY MOUTH DAILY, Disp: 100 tablet, Rfl: 3

## 2024-01-25 NOTE — ED Provider Notes (Addendum)
 " [place] [facility]GENC[facility] DE[facility]MENT AT [facility] Provider Note   CSN: [id number] Arrival date & time: 01/25/24  2125     Patient presents with: No chief complaint on file.   Lisa Mcknight is a 78 y.o. female.   Pt is a 78y/o female with hx of COPD, Dementia with dysphagia and nonverbal, Hypertension, Lung cancer who is presenting today with EMS for respiratory arrest.  Patient was diagnosed with aspiration pneumonia last week and was on Augmentin  and doxycycline .  She followed up with her PCP today and at that time her son was providing the history and stated that there was concern for dysphagia and wanting to see speech therapy.  At that time patient was denying any shortness of breath and vital signs were normal.  However patient got home today and family was trying to feed her and she developed respiratory distress.  When EMS arrived patient's sats were 50% on room air.  They initially tried her on BiPAP but sats never got above 80 and patient then became agonal with breathing and required being bagged.  Upon arrival here patient is not answering any questions or responding to stimuli.  She is agonal he breathing.  Initial sats were 70%.  Based on the chart patient is a full code.  The history is provided by the EMS personnel and medical records. The history is limited by the condition of the patient.       Prior to Admission medications  Medication Sig Start Date End Date Taking? Authorizing Provider  amLODipine  (NORVASC ) 2.5 MG tablet Take 5 mg by mouth daily. 07/06/21   [provider]  amoxicillin -clavulanate (AUGMENTIN ) 875-125 MG tablet Take 1 tablet by mouth every 12 (twelve) hours. 01/19/24   Yolande Lamar BROCKS, MD  carbidopa -levodopa  (SINEMET  IR) 25-100 MG tablet Take half a pill twice daily (8 AM and noon) for one week, then one pill twice daily 10/30/22   Rush Nest, MD  donepezil  (ARICEPT ) 10 MG tablet TAKE 1 TABLET(10 MG) BY MOUTH DAILY WITH BREAKFAST  01/03/24   Penumalli, Vikram R, MD  doxycycline  (VIBRAMYCIN ) 100 MG capsule Take 1 capsule (100 mg total) by mouth 2 (two) times daily for 7 days. 01/19/24 01/26/24  Yolande Lamar BROCKS, MD  hydrochlorothiazide  (HYDRODIURIL ) 25 MG tablet Take 25 mg by mouth daily.    [provider]  memantine  (NAMENDA ) 10 MG tablet TAKE 1 TABLET(10 MG) BY MOUTH TWICE DAILY 04/19/23   Ines Onetha NOVAK, MD  Multiple Vitamins-Minerals (MULTIVITAMINS THER. W/MINERALS) TABS Take 1 tablet by mouth daily.      [provider]    Allergies: Patient has no known allergies.    Review of Systems  Updated Vital Signs BP (!) 145/74   Pulse 98   Temp (!) 94.8 F (34.9 C)   Resp 17   SpO2 100%   Physical Exam Vitals and nursing note reviewed.  Constitutional:      General: She is in acute distress.     Appearance: She is well-developed.     Comments: Obtunded  HENT:     Head: Normocephalic and atraumatic.  Eyes:     Comments: Pupils are pinpoint.  Patient will occasionally blink  Cardiovascular:     Rate and Rhythm: Regular rhythm. Tachycardia present.     Heart sounds: Normal heart sounds. No murmur heard.    No friction rub.  Pulmonary:     Effort: Pulmonary effort is normal.     Breath sounds: Wheezing and rhonchi  present. No rales.  Abdominal:     General: Bowel sounds are normal. There is no distension.     Palpations: Abdomen is soft.     Tenderness: There is no abdominal tenderness. There is no guarding or rebound.  Musculoskeletal:        General: No tenderness. Normal range of motion.     Right lower leg: No edema.     Left lower leg: No edema.     Comments: No edema  Skin:    General: Skin is warm and dry.     Findings: No rash.  Neurological:     Mental Status: She is oriented to person, place, and time.     Cranial Nerves: No cranial nerve deficit.     Comments: No gag but patient after intubated did cough.  Currently patient is not following commands or moving  extremities.     (all labs ordered are listed, but only abnormal results are displayed) Labs Reviewed  COMPREHENSIVE METABOLIC PANEL WITH GFR - Abnormal; Notable for the following components:      Result Value   Glucose, Bld 233 (*)    Alkaline Phosphatase 185 (*)    All other components within normal limits  CBC WITH DIFFERENTIAL/PLATELET - Abnormal; Notable for the following components:   WBC 24.1 (*)    Platelets 403 (*)    Neutro Abs 16.3 (*)    Lymphs Abs 5.1 (*)    Monocytes Absolute 1.8 (*)    Eosinophils Absolute 0.6 (*)    Abs Immature Granulocytes 0.20 (*)    All other components within normal limits  BLOOD GAS, ARTERIAL - Abnormal; Notable for the following components:   pH, Arterial 7.21 (*)    pCO2 arterial 75 (*)    pO2, Arterial 230 (*)    Bicarbonate 30.0 (*)    All other components within normal limits  CBG MONITORING, ED - Abnormal; Notable for the following components:   Glucose-Capillary 219 (*)    All other components within normal limits  RESP PANEL BY RT-PCR (RSV, FLU A&B, COVID)  RVPGX2  CULTURE, BLOOD (ROUTINE X 2)  CULTURE, BLOOD (ROUTINE X 2)  PROTIME-INR  URINALYSIS, W/ REFLEX TO CULTURE (INFECTION SUSPECTED)  I-STAT CG4 LACTIC ACID, ED    EKG: EKG Interpretation Date/Time:  Friday January 25 2024 21:50:47 EST Ventricular Rate:  111 PR Interval:  150 QRS Duration:  122 QT Interval:  375 QTC Calculation: 510 R Axis:   49  Text Interpretation: Sinus tachycardia Biatrial enlargement Nonspecific intraventricular conduction delay Nonspecific repol abnormality, diffuse leads No significant change since last tracing Confirmed by Doretha Folks ([id number]) on 01/25/2024 10:05:46 PM  Radiology: No results found.   Procedure Name: Intubation Date/Time: 01/29/2024 11:39 AM  Performed by: Doretha Folks, MDPre-anesthesia Checklist: Patient identified Oxygen Delivery Method: Ambu bag Preoxygenation: Pre-oxygenation with 100%  oxygen Induction Type: Rapid sequence Ventilation: Two handed mask ventilation required Laryngoscope Size: 3 and Glidescope Grade View: Grade II Tube size: 7.5 mm Number of attempts: 1 Airway Equipment and Method: Video-laryngoscopy Placement Confirmation: ETT inserted through vocal cords under direct vision and CO2 detector Secured at: 21 cm Tube secured with: ETT holder Dental Injury: Teeth and Oropharynx as per pre-operative assessment  Difficulty Due To: Difficulty was unanticipated       Medications Ordered in the ED  etomidate  (AMIDATE ) 2 MG/ML injection (has no administration in time range)  lactated ringers  infusion (has no administration in time range)  lactated ringers  bolus 1,000 mL (has  no administration in time range)  ceFEPIme  (MAXIPIME ) 2 g in sodium chloride  0.9 % 100 mL IVPB (has no administration in time range)  fentaNYL  (SUBLIMAZE ) injection 50 mcg (50 mcg Intravenous Given 01/25/24 2245)  fentaNYL  (SUBLIMAZE ) injection 50-200 mcg (50 mcg Intravenous Given 01/25/24 2214)  midazolam  PF (VERSED ) injection 1-2 mg (has no administration in time range)  etomidate  (AMIDATE ) 2 MG/ML injection (30 mg  Given 01/25/24 2135)  midazolam  (VERSED ) 2 MG/2ML injection (2 mg  Given 01/25/24 2146)  fentaNYL  (SUBLIMAZE ) 50 MCG/ML injection (50 mcg  Given 01/25/24 2145)                                    Medical Decision Making Amount and/or Complexity of Data Reviewed Independent Historian: EMS External Data Reviewed: notes. Labs: ordered. Decision-making details documented in ED Course. Radiology: ordered and independent interpretation performed. Decision-making details documented in ED Course.  Risk Prescription drug management. Decision regarding hospitalization.   Pt with multiple medical problems and comorbidities and presenting today with a complaint that caries a high risk for morbidity and mortality.  Presenting today with respiratory arrest.  Patient is hypoxic  and agonal he breathing.  Based on chart review at this time patient is a full code no family members are present.  Due to patient not protecting her airway she was intubated.  Once intubated patient sats normalized.  Concern for worsening pneumonia or aspiration.  Patient has been on Augmentin  and doxycycline .  Was seen earlier today and sats were okay.  However symptoms worsened after trying to eat.  Patient does not have any signs concerning for fluid overload.  Lower suspicion for PE.  Patient sepsis order set was initiated.  She was covered with cefepime  due to being on antibiotics as an outpatient.  I independently interpreted patient's labs and EKG.  CBC with a leukocytosis of 24, normal hemoglobin, blood gas shows a respiratory acidosis with a CO2 of 75 and a pH of 7.21, CMP without acute findings other than hyperglycemia of 233.  Patient was covered with cefepime .  She was found to be hypothermic and Bair hugger was placed.  Patient will be admitted to the ICU.  All this was discussed with the patient's sons who are now here at bedside.  CRITICAL CARE Performed by: Renlee Floor Total critical care time: 40 minutes Critical care time was exclusive of separately billable procedures and treating other patients. Critical care was necessary to treat or prevent imminent or life-threatening deterioration. Critical care was time spent personally by me on the following activities: development of treatment plan with patient and/or surrogate as well as nursing, discussions with consultants, evaluation of patient's response to treatment, examination of patient, obtaining history from patient or surrogate, ordering and performing treatments and interventions, ordering and review of laboratory studies, ordering and review of radiographic studies, pulse oximetry and re-evaluation of patient's condition.     Final diagnoses:  Aspiration pneumonia, unspecified aspiration pneumonia type, unspecified  laterality, unspecified part of lung (HCC)  Respiratory arrest (HCC)  Acute respiratory failure with hypoxia and hypercapnia [facility])    ED Discharge Orders     None          Doretha Folks, MD 01/25/24 2259    Doretha Folks, MD 01/29/24 1140  "

## 2024-01-25 NOTE — Progress Notes (Addendum)
 Elink monitoring for the code sepsis protocol. 2130 Notified bedside nurse of need to draw repeat lactic acid.

## 2024-01-26 ENCOUNTER — Encounter (HOSPITAL_COMMUNITY): Payer: Self-pay

## 2024-01-26 ENCOUNTER — Inpatient Hospital Stay (HOSPITAL_COMMUNITY)

## 2024-01-26 ENCOUNTER — Other Ambulatory Visit: Payer: Self-pay

## 2024-01-26 DIAGNOSIS — I10 Essential (primary) hypertension: Secondary | ICD-10-CM | POA: Diagnosis present

## 2024-01-26 DIAGNOSIS — C7800 Secondary malignant neoplasm of unspecified lung: Secondary | ICD-10-CM | POA: Diagnosis not present

## 2024-01-26 DIAGNOSIS — Z1152 Encounter for screening for COVID-19: Secondary | ICD-10-CM | POA: Diagnosis not present

## 2024-01-26 DIAGNOSIS — F0390 Unspecified dementia without behavioral disturbance: Secondary | ICD-10-CM | POA: Diagnosis present

## 2024-01-26 DIAGNOSIS — J690 Pneumonitis due to inhalation of food and vomit: Secondary | ICD-10-CM

## 2024-01-26 DIAGNOSIS — E162 Hypoglycemia, unspecified: Secondary | ICD-10-CM | POA: Diagnosis not present

## 2024-01-26 DIAGNOSIS — M810 Age-related osteoporosis without current pathological fracture: Secondary | ICD-10-CM | POA: Diagnosis present

## 2024-01-26 DIAGNOSIS — Z515 Encounter for palliative care: Secondary | ICD-10-CM | POA: Diagnosis not present

## 2024-01-26 DIAGNOSIS — J189 Pneumonia, unspecified organism: Secondary | ICD-10-CM | POA: Diagnosis present

## 2024-01-26 DIAGNOSIS — J9601 Acute respiratory failure with hypoxia: Secondary | ICD-10-CM | POA: Diagnosis present

## 2024-01-26 DIAGNOSIS — C3490 Malignant neoplasm of unspecified part of unspecified bronchus or lung: Secondary | ICD-10-CM | POA: Diagnosis present

## 2024-01-26 DIAGNOSIS — Z604 Social exclusion and rejection: Secondary | ICD-10-CM | POA: Diagnosis present

## 2024-01-26 DIAGNOSIS — E079 Disorder of thyroid, unspecified: Secondary | ICD-10-CM

## 2024-01-26 DIAGNOSIS — J440 Chronic obstructive pulmonary disease with acute lower respiratory infection: Secondary | ICD-10-CM | POA: Diagnosis present

## 2024-01-26 DIAGNOSIS — J9602 Acute respiratory failure with hypercapnia: Secondary | ICD-10-CM | POA: Diagnosis present

## 2024-01-26 DIAGNOSIS — Z8249 Family history of ischemic heart disease and other diseases of the circulatory system: Secondary | ICD-10-CM | POA: Diagnosis not present

## 2024-01-26 DIAGNOSIS — R6520 Severe sepsis without septic shock: Secondary | ICD-10-CM | POA: Diagnosis present

## 2024-01-26 DIAGNOSIS — Z79899 Other long term (current) drug therapy: Secondary | ICD-10-CM | POA: Diagnosis not present

## 2024-01-26 DIAGNOSIS — A419 Sepsis, unspecified organism: Secondary | ICD-10-CM | POA: Diagnosis present

## 2024-01-26 DIAGNOSIS — Z87891 Personal history of nicotine dependence: Secondary | ICD-10-CM | POA: Diagnosis not present

## 2024-01-26 DIAGNOSIS — R1310 Dysphagia, unspecified: Secondary | ICD-10-CM

## 2024-01-26 DIAGNOSIS — C799 Secondary malignant neoplasm of unspecified site: Secondary | ICD-10-CM | POA: Diagnosis present

## 2024-01-26 DIAGNOSIS — R578 Other shock: Secondary | ICD-10-CM

## 2024-01-26 DIAGNOSIS — Z66 Do not resuscitate: Secondary | ICD-10-CM | POA: Diagnosis present

## 2024-01-26 LAB — GLUCOSE, CAPILLARY
Glucose-Capillary: 61 mg/dL — ABNORMAL LOW (ref 70–99)
Glucose-Capillary: 115 mg/dL — ABNORMAL HIGH (ref 70–99)
Glucose-Capillary: 87 mg/dL (ref 70–99)
Glucose-Capillary: 129 mg/dL — ABNORMAL HIGH (ref 70–99)
Glucose-Capillary: 135 mg/dL — ABNORMAL HIGH (ref 70–99)
Glucose-Capillary: 120 mg/dL — ABNORMAL HIGH (ref 70–99)
Glucose-Capillary: 117 mg/dL — ABNORMAL HIGH (ref 70–99)

## 2024-01-26 LAB — BLOOD GAS, ARTERIAL
Acid-Base Excess: 0.8 mmol/L (ref 0.0–2.0)
Acid-base deficit: 1.1 mmol/L (ref 0.0–2.0)
Bicarbonate: 30.7 mmol/L — ABNORMAL HIGH (ref 20.0–28.0)
Bicarbonate: 25.3 mmol/L (ref 20.0–28.0)
Drawn by: 23532
Drawn by: 74501
FIO2: 50 %
MECHVT: 430 mL
MECHVT: 430 mL
O2 Saturation: 82.8 %
O2 Saturation: 98.7 %
PEEP: 5 cmH2O
PEEP: 5 cmH2O
Patient temperature: 36.1
Patient temperature: 36.8
RATE: 18 {breaths}/min
RATE: 18 {breaths}/min
pCO2 arterial: 72 mmHg (ref 32–48)
pCO2 arterial: 48 mmHg (ref 32–48)
pH, Arterial: 7.23 — ABNORMAL LOW (ref 7.35–7.45)
pH, Arterial: 7.33 — ABNORMAL LOW (ref 7.35–7.45)
pO2, Arterial: 48 mmHg — ABNORMAL LOW (ref 83–108)
pO2, Arterial: 69 mmHg — ABNORMAL LOW (ref 83–108)

## 2024-01-26 LAB — ECHOCARDIOGRAM LIMITED
AV Mean grad: 8.3 mmHg
AV Peak grad: 14.6 mmHg
Ao pk vel: 1.91 m/s
Area-P 1/2: 4.63 cm2
Calc EF: 46.4 %
Height: 64 in
Single Plane A2C EF: 48.8 %
Single Plane A4C EF: 45 %
Weight: 1499.13 [oz_av]

## 2024-01-26 LAB — CBC
HCT: 40.5 % (ref 36.0–46.0)
Hemoglobin: 12.3 g/dL (ref 12.0–15.0)
MCH: 28.6 pg (ref 26.0–34.0)
MCHC: 30.4 g/dL (ref 30.0–36.0)
MCV: 94.2 fL (ref 80.0–100.0)
Platelets: 326 K/uL (ref 150–400)
RBC: 4.3 MIL/uL (ref 3.87–5.11)
RDW: 13.7 % (ref 11.5–15.5)
WBC: 13.6 K/uL — ABNORMAL HIGH (ref 4.0–10.5)
nRBC: 0 % (ref 0.0–0.2)

## 2024-01-26 LAB — CBG MONITORING, ED: Glucose-Capillary: 130 mg/dL — ABNORMAL HIGH (ref 70–99)

## 2024-01-26 LAB — BASIC METABOLIC PANEL WITH GFR
Anion gap: 10 (ref 5–15)
BUN: 18 mg/dL (ref 8–23)
CO2: 25 mmol/L (ref 22–32)
Calcium: 9.2 mg/dL (ref 8.9–10.3)
Chloride: 108 mmol/L (ref 98–111)
Creatinine, Ser: 0.52 mg/dL (ref 0.44–1.00)
GFR, Estimated: >60 mL/min
Glucose, Bld: 88 mg/dL (ref 70–99)
Potassium: 4.4 mmol/L (ref 3.5–5.1)
Sodium: 143 mmol/L (ref 135–145)

## 2024-01-26 LAB — URINALYSIS, W/ REFLEX TO CULTURE (INFECTION SUSPECTED)
Bilirubin Urine: NEGATIVE
Glucose, UA: NEGATIVE mg/dL
Hgb urine dipstick: NEGATIVE
Ketones, ur: NEGATIVE mg/dL
Leukocytes,Ua: NEGATIVE
Nitrite: NEGATIVE
Protein, ur: 100 mg/dL — AB
Specific Gravity, Urine: 1.015 (ref 1.005–1.030)
pH: 5 (ref 5.0–8.0)

## 2024-01-26 LAB — PHOSPHORUS: Phosphorus: 4.7 mg/dL — ABNORMAL HIGH (ref 2.5–4.6)

## 2024-01-26 LAB — LACTIC ACID, PLASMA
Lactic Acid, Venous: 1.8 mmol/L (ref 0.5–1.9)
Lactic Acid, Venous: 2.8 mmol/L (ref 0.5–1.9)

## 2024-01-26 LAB — HEMOGLOBIN A1C
Hgb A1c MFr Bld: 5.4 % (ref 4.8–5.6)
Mean Plasma Glucose: 108.28 mg/dL

## 2024-01-26 LAB — PRO BRAIN NATRIURETIC PEPTIDE: Pro Brain Natriuretic Peptide: 1406 pg/mL — ABNORMAL HIGH

## 2024-01-26 LAB — PROTIME-INR
INR: 1 (ref 0.8–1.2)
Prothrombin Time: 13.4 s (ref 11.4–15.2)

## 2024-01-26 LAB — CREATININE, SERUM
Creatinine, Ser: 0.52 mg/dL (ref 0.44–1.00)
GFR, Estimated: >60 mL/min

## 2024-01-26 LAB — MRSA NEXT GEN BY PCR, NASAL: MRSA by PCR Next Gen: NOT DETECTED

## 2024-01-26 LAB — MAGNESIUM: Magnesium: 2.7 mg/dL — ABNORMAL HIGH (ref 1.7–2.4)

## 2024-01-26 MED ORDER — PROPOFOL 1000 MG/100ML IV EMUL
0.0000 ug/kg/min | INTRAVENOUS | Status: DC
  Administered 2024-01-26: 5 ug/kg/min via INTRAVENOUS
  Filled 2024-01-26: qty 100

## 2024-01-26 MED ORDER — CHLORHEXIDINE GLUCONATE CLOTH 2 % EX PADS
6.0000 | MEDICATED_PAD | Freq: Every day | CUTANEOUS | Status: DC
  Administered 2024-01-26 – 2024-01-27 (×2): 6 via TOPICAL

## 2024-01-26 MED ORDER — INSULIN ASPART 100 UNIT/ML IJ SOLN
0.0000 [IU] | INTRAMUSCULAR | Status: DC
  Administered 2024-01-26 (×3): 1 [IU] via SUBCUTANEOUS
  Filled 2024-01-26 (×3): qty 1

## 2024-01-26 MED ORDER — DEXTROSE 50 % IV SOLN
INTRAVENOUS | Status: AC
  Filled 2024-01-26: qty 50

## 2024-01-26 MED ORDER — DEXTROSE 50 % IV SOLN
12.5000 g | INTRAVENOUS | Status: AC
  Administered 2024-01-26: 12.5 g via INTRAVENOUS

## 2024-01-26 MED ORDER — FUROSEMIDE 10 MG/ML IJ SOLN
20.0000 mg | Freq: Once | INTRAMUSCULAR | Status: AC
  Administered 2024-01-26: 20 mg via INTRAVENOUS
  Filled 2024-01-26: qty 2

## 2024-01-26 MED ORDER — ORAL CARE MOUTH RINSE: 15.0000 mL | OROMUCOSAL | Status: DC

## 2024-01-26 MED ORDER — SODIUM CHLORIDE 0.9 % IV SOLN: 250.0000 mL | INTRAVENOUS | Status: AC

## 2024-01-26 MED ORDER — FENTANYL 2500MCG IN NS 250ML (10MCG/ML) PREMIX INFUSION
0.0000 ug/h | INTRAVENOUS | Status: DC
  Administered 2024-01-26: 25 ug/h via INTRAVENOUS
  Filled 2024-01-26: qty 250

## 2024-01-26 MED ORDER — DOCUSATE SODIUM 50 MG/5ML PO LIQD
100.0000 mg | Freq: Two times a day (BID) | ORAL | Status: DC
  Administered 2024-01-26: 100 mg
  Filled 2024-01-26 (×2): qty 10

## 2024-01-26 MED ORDER — LACTATED RINGERS IV BOLUS
500.0000 mL | Freq: Once | INTRAVENOUS | Status: AC
  Administered 2024-01-26: 500 mL via INTRAVENOUS

## 2024-01-26 MED ORDER — VANCOMYCIN HCL 750 MG/150ML IV SOLN
750.0000 mg | Freq: Once | INTRAVENOUS | Status: AC
  Administered 2024-01-26: 750 mg via INTRAVENOUS
  Filled 2024-01-26: qty 150

## 2024-01-26 MED ORDER — ONDANSETRON HCL 4 MG/2ML IJ SOLN: 4.0000 mg | Freq: Four times a day (QID) | INTRAMUSCULAR | Status: DC | PRN

## 2024-01-26 MED ORDER — ORAL CARE MOUTH RINSE: 15.0000 mL | OROMUCOSAL | Status: DC | PRN

## 2024-01-26 MED ORDER — ORAL CARE MOUTH RINSE
15.0000 mL | OROMUCOSAL | Status: DC
  Administered 2024-01-26 (×4): 15 mL via OROMUCOSAL

## 2024-01-26 MED ORDER — METRONIDAZOLE 500 MG/100ML IV SOLN
500.0000 mg | Freq: Two times a day (BID) | INTRAVENOUS | Status: DC
  Administered 2024-01-26 – 2024-01-27 (×4): 500 mg via INTRAVENOUS
  Filled 2024-01-26 (×4): qty 100

## 2024-01-26 MED ORDER — POLYETHYLENE GLYCOL 3350 17 G PO PACK
17.0000 g | PACK | Freq: Every day | ORAL | Status: DC
  Administered 2024-01-26: 17 g
  Filled 2024-01-26: qty 1

## 2024-01-26 MED ORDER — ACETAMINOPHEN 325 MG PO TABS: 650.0000 mg | ORAL_TABLET | Freq: Four times a day (QID) | ORAL | Status: DC | PRN

## 2024-01-26 MED ORDER — LACTATED RINGERS IV BOLUS
1000.0000 mL | Freq: Once | INTRAVENOUS | Status: AC
  Administered 2024-01-26: 1000 mL via INTRAVENOUS

## 2024-01-26 MED ORDER — HEPARIN SODIUM (PORCINE) 5000 UNIT/ML IJ SOLN
5000.0000 [IU] | Freq: Three times a day (TID) | INTRAMUSCULAR | Status: DC
  Administered 2024-01-26 – 2024-01-28 (×7): 5000 [IU] via SUBCUTANEOUS
  Filled 2024-01-26 (×6): qty 1

## 2024-01-26 MED ORDER — SODIUM CHLORIDE 0.9 % IV SOLN
2.0000 g | Freq: Two times a day (BID) | INTRAVENOUS | Status: DC
  Administered 2024-01-26 – 2024-01-28 (×5): 2 g via INTRAVENOUS
  Filled 2024-01-26 (×4): qty 12.5

## 2024-01-26 MED ORDER — ACETAMINOPHEN 10 MG/ML IV SOLN
1000.0000 mg | Freq: Four times a day (QID) | INTRAVENOUS | Status: AC | PRN
  Administered 2024-01-26: 1000 mg via INTRAVENOUS
  Filled 2024-01-26: qty 100

## 2024-01-26 MED ORDER — DEXTROSE IN LACTATED RINGERS 5 % IV SOLN: INTRAVENOUS | Status: AC

## 2024-01-26 MED ORDER — VANCOMYCIN HCL 750 MG/150ML IV SOLN: 750.0000 mg | INTRAVENOUS | Status: DC

## 2024-01-26 MED ORDER — FENTANYL BOLUS VIA INFUSION: 25.0000 ug | INTRAVENOUS | Status: DC | PRN

## 2024-01-26 MED ORDER — NOREPINEPHRINE 4 MG/250ML-% IV SOLN
0.0000 ug/min | INTRAVENOUS | Status: DC
  Administered 2024-01-26: 2 ug/min via INTRAVENOUS
  Filled 2024-01-26: qty 250

## 2024-01-26 MED ORDER — ACETAMINOPHEN 10 MG/ML IV SOLN
1000.0000 mg | Freq: Once | INTRAVENOUS | Status: AC
  Administered 2024-01-26: 1000 mg via INTRAVENOUS
  Filled 2024-01-26: qty 100

## 2024-01-26 MED ORDER — PANTOPRAZOLE SODIUM 40 MG IV SOLR
40.0000 mg | Freq: Every day | INTRAVENOUS | Status: DC
  Administered 2024-01-26 – 2024-01-27 (×3): 40 mg via INTRAVENOUS
  Filled 2024-01-26 (×3): qty 10

## 2024-01-26 NOTE — Progress Notes (Addendum)
 eLink Physician-Brief Progress Note Patient Name: Lisa Mcknight DOB: 1945-04-13 MRN: [id number]   Date of Service  01/26/2024  HPI/Events of Note  Reviewed CCM note and saw patient on camera. On 40% fio2 peep 5.   eICU Interventions  Add PRN fentanyl  and PRN tylenol  for fever Call E link if needed      Intervention Category Major Interventions: Respiratory failure - evaluation and management Evaluation Type: New Patient Evaluation  Marshon Bangs G Karolyna Bianchini 01/26/2024, 2:40 AM  245 am - NG tube could not be placed. High fever. HR 140 with sinus tach, Will give IV tylenol  and focus on fever control.   315 am - Hypoglyucemia and RN followed protocol but asking if fluids should be changed. Switch to D5 LR and follow gluc every 2 hours x 3  415 am - Borderline Bps. Has had low diastolic BP which is driving the MAP lower. Is already on 10 mic of levophed . Needing sedation on vent. Is on low dose propofol  only at 20 mic and 50 mic of fentanyl . Will titrate fentanyl  instead of propofol . Bolus LR 500 cc. O2 sat is 97 while on 40% fio2. HR is down to 101. Fever is also down. Will use the goal as SBP > 90 instead of MAP goal. Dw RN

## 2024-01-26 NOTE — Progress Notes (Signed)
 Pharmacy Antibiotic Note  Lisa Mcknight is a 78 y.o. female admitted on 01/25/2024 with pneumonia.  Pharmacy has been consulted for vancomycin  dosing.  Plan: Vancomycin  750mg  IV x 1 then 750mg  q36h (AUC 492.4, Scr used 0.8, TBW) Follow renal function and clinical course     Temp (24hrs), Avg:96.2 F (35.7 C), Min:94.8 F (34.9 C), Max:98.1 F (36.7 C)  Recent Labs  Lab 01/19/24 1724 01/25/24 2134 01/25/24 2317  WBC 11.6* 24.1*  --   CREATININE 0.60 0.67  --   LATICACIDVEN  --   --  2.0*    Estimated Creatinine Clearance: 36.5 mL/min (by C-G formula based on SCr of 0.67 mg/dL).    Allergies[1]  Antimicrobials this admission: 12/26 cefepime  >> 12/27 vanc >>  Dose adjustments this admission:   Microbiology results: 12/26 BCx:   12/26 Sputum:   12/26 MRSA PCR:   Thank you for allowing pharmacy to be a part of this patients care.  Leeroy Mace RPh 01/26/2024, 2:13 AM      [1] No Known Allergies

## 2024-01-26 NOTE — Plan of Care (Signed)
" °  Problem: Metabolic: Goal: Ability to maintain appropriate glucose levels will improve Outcome: Not Progressing   Problem: Nutritional: Goal: Maintenance of adequate nutrition will improve Outcome: Not Progressing Goal: Progress toward achieving an optimal weight will improve Outcome: Not Progressing   Problem: Tissue Perfusion: Goal: Adequacy of tissue perfusion will improve Outcome: Not Progressing   Problem: Clinical Measurements: Goal: Will remain free from infection Outcome: Not Progressing Goal: Diagnostic test results will improve Outcome: Not Progressing Goal: Respiratory complications will improve Outcome: Not Progressing Goal: Cardiovascular complication will be avoided Outcome: Not Progressing   Problem: Nutrition: Goal: Adequate nutrition will be maintained Outcome: Not Progressing   Problem: Coping: Goal: Level of anxiety will decrease Outcome: Not Progressing   "

## 2024-01-26 NOTE — H&P (Signed)
 "  NAME:  Lisa Mcknight, MRN:  [id number], DOB:  Dec 21, 1945, LOS: 0 ADMISSION DATE:  01/25/2024, CONSULTATION DATE:  01/26/2024 REFERRING MD:  Benton Shone, MD, CHIEF COMPLAINT:  Respiratory distress   History of Present Illness:  78 y/o female with PMH for Lung cancer s/p Biopsy and RT, Thyroid nodule newly found and not worked up yet, HTN, Weight loss, Osteoporosis, Advanced dementia who has 8 hours home cae, needs to be fed and wears a diaper and is non-verbal with minimal movement during the day as a result advanced Dementia was in her usual state of health and was being fed when she started coughing and then went into respiratory distress.  EMS was called who found her with low O2 sats and gave her Duonebs x 2, 0.3mg  Epi IM and 2 grams of Mg along with O2 en route to ED.  Earlier she had a check up with her doctor and was in her sual sate of health without any urgent issues.  She was in the ED last Saturday for Aspiration event as well but d/c from ED.  Initially BiPAP tried but she was agonal and so she was intubated in ED. WBC 24k, PH 7.21. Pertinent  Medical History  ung cancer s/p Biopsy and RT, Thyroid nodule newly found and not worked up yet, HTN, Weight loss, Osteoporosis, Advanced dementia  Significant Hospital Events: Including procedures, antibiotic start and stop dates in addition to other pertinent events   12/27: admit to ICU  Interim History / Subjective:  CT 12/20: 1. 5.2 cm right hilar mass with severe narrowing of the right middle lobe pulmonary artery and additional mild to moderate narrowing of the right upper and lower lobe pulmonary arteries. Compression or invasion of the right lower and middle lobe bronchi, which are completely occluded. 2. 5.9 cm cyst mass with irregular margins and wall thickening in the right upper posterior chest with destruction of the right posterior 5th rib, measuring 9 x 4.3 cm. 3. Bilateral pulmonary metastases and mediastinal nodal  metastases. 4. Partially visualized indeterminate hypoattenuating right hepatic lobe lesion; metastatic disease is not excluded. 5. Ground glass and tree-in-bud opacities in the lower lungs, likely due to aspiration and/or small airway infection/inflammation. 6. 2.6 cm right thyroid nodule, increased in size compared to 2018; recommend non-emergent thyroid ultrasound if clinically appropriate given comorbidities. 7. Multiple age indeterminate compression fractures in the thoracic spine.  Objective    Blood pressure 108/74, pulse 99, temperature (!) 95.8 F (35.4 C), resp. rate 18, SpO2 96%.    Vent Mode: PRVC FiO2 (%):  [50 %-100 %] 50 % Set Rate:  [18 bmp] 18 bmp Vt Set:  [400 mL] 400 mL PEEP:  [5 cmH20] 5 cmH20 Plateau Pressure:  [20 cmH20-22 cmH20] 22 cmH20   Intake/Output Summary (Last 24 hours) at 01/26/2024 0024 Last data filed at 01/25/2024 2347 Gross per 24 hour  Intake 100 ml  Output --  Net 100 ml   There were no vitals filed for this visit.  Examination: General: thin elderly female on vent NAD HENT: perrla no icterus, supple no JVD Lungs: Course BS b/l Cardiovascular: reg s1s2 no murmurs Abdomen: soft nt nd bs pos no guarding Extremities: no edema, cyanosis, clubbing Neuro: sedated and intubated, non-verbal at baseline, minimally walks with assistance   Resolved problem list   Assessment and Plan  Acute hypoxic respiratory failure Vent management SAT/SBT when medically appropriate Follow ABGs/VBGs/ O2 sats Sedation for intubation and mech vent Aspiration pneumonia Broad spectrum antibiotics  Tracheal aspirates for culture Metastatic Lung cancer Supportive care Thyroid mass Work up post extubation Dysphagia Swallow eval post extubation Advanced dementia Supportive care Osteoporosis Nutritional/supplemental and supportive care  Two of 3 sons at bedside want full code status Prognosis overall is poor   Labs   CBC: Recent Labs  Lab  01/19/24 1724 01/25/24 2134  WBC 11.6* 24.1*  NEUTROABS 9.3* 16.3*  HGB 13.5 13.2  HCT 43.0 43.2  MCV 92.9 93.9  PLT 341 403*    Basic Metabolic Panel: Recent Labs  Lab 01/19/24 1724 01/25/24 2134  NA 141 145  K 3.7 3.5  CL 104 105  CO2 25 30  GLUCOSE 93 233*  BUN 20 18  CREATININE 0.60 0.67  CALCIUM 10.0 10.1   GFR: Estimated Creatinine Clearance: 36.5 mL/min (by C-G formula based on SCr of 0.67 mg/dL). Recent Labs  Lab 01/19/24 1724 01/25/24 2134 01/25/24 2317  WBC 11.6* 24.1*  --   LATICACIDVEN  --   --  2.0*    Liver Function Tests: Recent Labs  Lab 01/25/24 2134  AST 32  ALT 38  ALKPHOS 185*  BILITOT 0.2  PROT 7.8  ALBUMIN  4.0   No results for input(s): LIPASE, AMYLASE in the last 168 hours. No results for input(s): AMMONIA in the last 168 hours.  ABG    Component Value Date/Time   PHART 7.21 (L) 01/25/2024 2203   PCO2ART 75 (HH) 01/25/2024 2203   PO2ART 230 (H) 01/25/2024 2203   HCO3 30.0 (H) 01/25/2024 2203   TCO2 30 01/13/2011 1408   O2SAT 100 01/25/2024 2203     Coagulation Profile: No results for input(s): INR, PROTIME in the last 168 hours.  Cardiac Enzymes: No results for input(s): CKTOTAL, CKMB, CKMBINDEX, TROPONINI in the last 168 hours.  HbA1C: No results found for: HGBA1C  CBG: Recent Labs  Lab 01/25/24 2134  GLUCAP 219*    Review of Systems:   Nonverbal at baseline, sedated and intubated  Past Medical History:  She,  has a past medical history of Headache, Hypertension, Memory difficulty, Pneumonia, Tremor, and Unsteady gait.   Surgical History:   Past Surgical History:  Procedure Laterality Date   TONSILLECTOMY       Social History:   reports that she quit smoking about 2 years ago. Her smoking use included cigarettes. She has never used smokeless tobacco. She reports that she does not currently use alcohol. She reports that she does not use drugs.   Family History:  Her family history  includes Heart disease in her brother and father.   Allergies Allergies[1]   Home Medications  Prior to Admission medications  Medication Sig Start Date End Date Taking? Authorizing Provider  amLODipine  (NORVASC ) 2.5 MG tablet Take 5 mg by mouth daily. 07/06/21   [provider]  amoxicillin -clavulanate (AUGMENTIN ) 875-125 MG tablet Take 1 tablet by mouth every 12 (twelve) hours. 01/19/24   Yolande Lamar BROCKS, MD  carbidopa -levodopa  (SINEMET  IR) 25-100 MG tablet Take half a pill twice daily (8 AM and noon) for one week, then one pill twice daily 10/30/22   Rush Nest, MD  donepezil  (ARICEPT ) 10 MG tablet TAKE 1 TABLET(10 MG) BY MOUTH DAILY WITH BREAKFAST 01/03/24   Penumalli, Vikram R, MD  doxycycline  (VIBRAMYCIN ) 100 MG capsule Take 1 capsule (100 mg total) by mouth 2 (two) times daily for 7 days. 01/19/24 01/26/24  Yolande Lamar BROCKS, MD  hydrochlorothiazide  (HYDRODIURIL ) 25 MG tablet Take 25 mg by mouth daily.    [provider]  memantine  (NAMENDA ) 10 MG tablet TAKE 1 TABLET(10 MG) BY MOUTH TWICE DAILY 04/19/23   Ines Onetha NOVAK, MD  Multiple Vitamins-Minerals (MULTIVITAMINS THER. W/MINERALS) TABS Take 1 tablet by mouth daily.      [provider]     Critical care time: 66   The patient is critically ill with multiple organ system failure and requires high complexity decision making for assessment and support, frequent evaluation and titration of therapies, advanced monitoring, review of radiographic studies and interpretation of complex data.   Critical Care Time devoted to patient care services, exclusive of separately billable procedures, described in this note is 32 minutes.   Orlin Fairly, MD [facility] See Amion for pager  If no response to pager , please call [phone] until 7pm After 7:00 pm call Elink  [phone] 01/26/2024, 12:25 AM            [1] No Known Allergies  "

## 2024-01-26 NOTE — Progress Notes (Signed)
 "  NAME:  Lisa Mcknight, MRN:  [id number], DOB:  04/18/1945, LOS: 0 ADMISSION DATE:  01/25/2024, CONSULTATION DATE:  01/26/2024 REFERRING MD:  Benton Shone, MD, CHIEF COMPLAINT:  Respiratory distress   History of Present Illness:  78 y/o female with PMH for Lung cancer s/p Biopsy and RT, Thyroid nodule newly found and not worked up yet, HTN, Weight loss, Osteoporosis, Advanced dementia who has 8 hours home cae, needs to be fed and wears a diaper and is non-verbal with minimal movement during the day as a result advanced Dementia was in her usual state of health and was being fed when she started coughing and then went into respiratory distress.  EMS was called who found her with low O2 sats and gave her Duonebs x 2, 0.3mg  Epi IM and 2 grams of Mg along with O2 en route to ED.  Earlier she had a check up with her doctor and was in her sual sate of health without any urgent issues.  She was in the ED last Saturday for Aspiration event as well but d/c from ED.  Initially BiPAP tried but she was agonal and so she was intubated in ED. WBC 24k, PH 7.21. Pertinent  Medical History  ung cancer s/p Biopsy and RT, Thyroid nodule newly found and not worked up yet, HTN, Weight loss, Osteoporosis, Advanced dementia  Significant Hospital Events: Including procedures, antibiotic start and stop dates in addition to other pertinent events   12/27: admit to ICU  Interim History / Subjective:  CT 12/20: 1. 5.2 cm right hilar mass with severe narrowing of the right middle lobe pulmonary artery and additional mild to moderate narrowing of the right upper and lower lobe pulmonary arteries. Compression or invasion of the right lower and middle lobe bronchi, which are completely occluded. 2. 5.9 cm cyst mass with irregular margins and wall thickening in the right upper posterior chest with destruction of the right posterior 5th rib, measuring 9 x 4.3 cm. 3. Bilateral pulmonary metastases and mediastinal nodal  metastases. 4. Partially visualized indeterminate hypoattenuating right hepatic lobe lesion; metastatic disease is not excluded. 5. Ground glass and tree-in-bud opacities in the lower lungs, likely due to aspiration and/or small airway infection/inflammation. 6. 2.6 cm right thyroid nodule, increased in size compared to 2018; recommend non-emergent thyroid ultrasound if clinically appropriate given comorbidities. 7. Multiple age indeterminate compression fractures in the thoracic spine.  Objective    Blood pressure (!) 111/41, pulse 83, temperature 98.2 F (36.8 C), resp. rate 18, height 5' 4 (1.626 m), weight 42.5 kg, SpO2 96%.    Vent Mode: PRVC FiO2 (%):  [40 %-100 %] 40 % Set Rate:  [18 bmp] 18 bmp Vt Set:  [400 mL-430 mL] 430 mL PEEP:  [5 cmH20] 5 cmH20 Plateau Pressure:  [10 cmH20-22 cmH20] 10 cmH20   Intake/Output Summary (Last 24 hours) at 01/26/2024 0944 Last data filed at 01/26/2024 0620 Gross per 24 hour  Intake 2407.74 ml  Output 320 ml  Net 2087.74 ml   Filed Weights   01/26/24 0244  Weight: 42.5 kg    Examination: General: thin elderly female on vent NAD HENT: no icterus, supple no JVD Lungs: rhonchi on right intermittently, clear on left Cardiovascular: reg s1s2 no murmurs Abdomen: soft, non-tender, BS+ Extremities: no edema, cyanosis, clubbing Neuro: PERRL, following intermittent commands, moving extremities   Resolved problem list   Assessment and Plan  Acute hypoxic respiratory failure Aspiration pneumonia - Vent management - SAT/SBT this AM - Follow ABGs/VBGs/  O2 sats - continue cefepime  and flagyl  - stop vancomycin , MRSA screen negative - Tracheal aspirates for culture  Metastatic Lung cancer - Supportive care  Thyroid mass - Work up post extubation  Dysphagia - Swallow eval post extubation  Advanced dementia - Supportive care  Osteoporosis - Nutritional/supplemental and supportive care  3 sons at bedside want full code  status Prognosis overall is poor Palliative care consult placed  Labs   CBC: Recent Labs  Lab 01/19/24 1724 01/25/24 2134 01/26/24 0250  WBC 11.6* 24.1* 13.6*  NEUTROABS 9.3* 16.3*  --   HGB 13.5 13.2 12.3  HCT 43.0 43.2 40.5  MCV 92.9 93.9 94.2  PLT 341 403* 326    Basic Metabolic Panel: Recent Labs  Lab 01/19/24 1724 01/25/24 2134 01/26/24 0250 01/26/24 0251  NA 141 145  --  143  K 3.7 3.5  --  4.4  CL 104 105  --  108  CO2 25 30  --  25  GLUCOSE 93 233*  --  88  BUN 20 18  --  18  CREATININE 0.60 0.67 0.52 0.52  CALCIUM 10.0 10.1  --  9.2  MG  --   --   --  2.7*  PHOS  --   --   --  4.7*   GFR: Estimated Creatinine Clearance: 38.3 mL/min (by C-G formula based on SCr of 0.52 mg/dL). Recent Labs  Lab 01/19/24 1724 01/25/24 2134 01/25/24 2317 01/26/24 0250 01/26/24 0351 01/26/24 0715  WBC 11.6* 24.1*  --  13.6*  --   --   LATICACIDVEN  --   --  2.0*  --  1.8 2.8*    Liver Function Tests: Recent Labs  Lab 01/25/24 2134  AST 32  ALT 38  ALKPHOS 185*  BILITOT 0.2  PROT 7.8  ALBUMIN  4.0   No results for input(s): LIPASE, AMYLASE in the last 168 hours. No results for input(s): AMMONIA in the last 168 hours.  ABG    Component Value Date/Time   PHART 7.33 (L) 01/26/2024 0521   PCO2ART 48 01/26/2024 0521   PO2ART 69 (L) 01/26/2024 0521   HCO3 25.3 01/26/2024 0521   TCO2 30 01/13/2011 1408   ACIDBASEDEF 1.1 01/26/2024 0521   O2SAT 98.7 01/26/2024 0521     Coagulation Profile: Recent Labs  Lab 01/25/24 2134  INR 1.0    Cardiac Enzymes: No results for input(s): CKTOTAL, CKMB, CKMBINDEX, TROPONINI in the last 168 hours.  HbA1C: Hgb A1c MFr Bld  Date/Time Value Ref Range Status  01/26/2024 02:50 AM 5.4 4.8 - 5.6 % Final    Comment:    (NOTE) Diagnosis of Diabetes The following HbA1c ranges recommended by the American Diabetes Association (ADA) may be used as an aid in the diagnosis of diabetes mellitus.  Hemoglobin              Suggested A1C NGSP%              Diagnosis  <5.7                   Non Diabetic  5.7-6.4                Pre-Diabetic  >6.4                   Diabetic  <7.0                   Glycemic control for  adults with diabetes.      CBG: Recent Labs  Lab 01/26/24 0136 01/26/24 0310 01/26/24 0337 01/26/24 0543 01/26/24 0816  GLUCAP 130* 61* 115* 87 129*    Critical care time: 35 minutes   The patient is critically ill with multiple organ system failure and requires high complexity decision making for assessment and support, frequent evaluation and titration of therapies, advanced monitoring, review of radiographic studies and interpretation of complex data.   Critical Care Time devoted to patient care services, exclusive of separately billable procedures, described in this note is 35 minutes.   Dorn Chill, MD [facility] Office: [phone]   See Amion for personal pager PCCM on call pager [phone] until 7pm. Please call Elink 7p-7a. [phone]          "

## 2024-01-27 ENCOUNTER — Inpatient Hospital Stay (HOSPITAL_COMMUNITY)

## 2024-01-27 DIAGNOSIS — R1310 Dysphagia, unspecified: Secondary | ICD-10-CM | POA: Diagnosis not present

## 2024-01-27 DIAGNOSIS — J9601 Acute respiratory failure with hypoxia: Secondary | ICD-10-CM | POA: Diagnosis not present

## 2024-01-27 DIAGNOSIS — C7800 Secondary malignant neoplasm of unspecified lung: Secondary | ICD-10-CM | POA: Diagnosis not present

## 2024-01-27 DIAGNOSIS — J690 Pneumonitis due to inhalation of food and vomit: Secondary | ICD-10-CM | POA: Diagnosis not present

## 2024-01-27 LAB — BASIC METABOLIC PANEL WITH GFR
Anion gap: 8 (ref 5–15)
BUN: 10 mg/dL (ref 8–23)
CO2: 27 mmol/L (ref 22–32)
Calcium: 8.3 mg/dL — ABNORMAL LOW (ref 8.9–10.3)
Chloride: 104 mmol/L (ref 98–111)
Creatinine, Ser: 0.51 mg/dL (ref 0.44–1.00)
GFR, Estimated: >60 mL/min
Glucose, Bld: 376 mg/dL — ABNORMAL HIGH (ref 70–99)
Potassium: 3 mmol/L — ABNORMAL LOW (ref 3.5–5.1)
Sodium: 138 mmol/L (ref 135–145)

## 2024-01-27 LAB — CBC WITH DIFFERENTIAL/PLATELET
Abs Immature Granulocytes: 0.32 K/uL — ABNORMAL HIGH (ref 0.00–0.07)
Basophils Absolute: 0.1 K/uL (ref 0.0–0.1)
Basophils Relative: 0 %
Eosinophils Absolute: 0.1 K/uL (ref 0.0–0.5)
Eosinophils Relative: 0 %
HCT: 29 % — ABNORMAL LOW (ref 36.0–46.0)
Hemoglobin: 8.9 g/dL — ABNORMAL LOW (ref 12.0–15.0)
Immature Granulocytes: 2 %
Lymphocytes Relative: 3 %
Lymphs Abs: 0.5 K/uL — ABNORMAL LOW (ref 0.7–4.0)
MCH: 28.7 pg (ref 26.0–34.0)
MCHC: 30.7 g/dL (ref 30.0–36.0)
MCV: 93.5 fL (ref 80.0–100.0)
Monocytes Absolute: 0.9 K/uL (ref 0.1–1.0)
Monocytes Relative: 6 %
Neutro Abs: 13.7 K/uL — ABNORMAL HIGH (ref 1.7–7.7)
Neutrophils Relative %: 89 %
Platelets: 215 K/uL (ref 150–400)
RBC: 3.1 MIL/uL — ABNORMAL LOW (ref 3.87–5.11)
RDW: 13.8 % (ref 11.5–15.5)
WBC: 15.5 K/uL — ABNORMAL HIGH (ref 4.0–10.5)
nRBC: 0 % (ref 0.0–0.2)

## 2024-01-27 LAB — TRIGLYCERIDES: Triglycerides: 35 mg/dL

## 2024-01-27 LAB — MAGNESIUM: Magnesium: 1.7 mg/dL (ref 1.7–2.4)

## 2024-01-27 LAB — GLUCOSE, CAPILLARY
Glucose-Capillary: 112 mg/dL — ABNORMAL HIGH (ref 70–99)
Glucose-Capillary: 83 mg/dL (ref 70–99)
Glucose-Capillary: 89 mg/dL (ref 70–99)

## 2024-01-27 LAB — PHOSPHORUS: Phosphorus: 2.5 mg/dL (ref 2.5–4.6)

## 2024-01-27 MED ORDER — SODIUM CHLORIDE 0.9% FLUSH: 10.0000 mL | INTRAVENOUS | Status: DC | PRN

## 2024-01-27 MED ORDER — ALBUMIN HUMAN 25 % IV SOLN
25.0000 g | Freq: Four times a day (QID) | INTRAVENOUS | Status: DC
  Administered 2024-01-27 (×2): 12.5 g via INTRAVENOUS
  Administered 2024-01-27 – 2024-01-28 (×3): 25 g via INTRAVENOUS
  Filled 2024-01-27 (×3): qty 100

## 2024-01-27 MED ORDER — DEXTROSE IN LACTATED RINGERS 5 % IV SOLN: INTRAVENOUS | Status: DC

## 2024-01-27 MED ORDER — DEXMEDETOMIDINE HCL IN NACL 400 MCG/100ML IV SOLN
0.0000 ug/kg/h | INTRAVENOUS | Status: DC
  Administered 2024-01-27: 0.5 ug/kg/h via INTRAVENOUS
  Administered 2024-01-27: 0.4 ug/kg/h via INTRAVENOUS
  Filled 2024-01-27: qty 100

## 2024-01-27 MED ORDER — DEXMEDETOMIDINE HCL IN NACL 200 MCG/50ML IV SOLN
0.0000 ug/kg/h | INTRAVENOUS | Status: DC
  Administered 2024-01-27: 0.4 ug/kg/h via INTRAVENOUS
  Administered 2024-01-27: 0.7 ug/kg/h via INTRAVENOUS
  Filled 2024-01-27 (×3): qty 50

## 2024-01-27 MED ORDER — DEXTROSE 10 % IV SOLN: INTRAVENOUS | Status: DC

## 2024-01-27 MED ORDER — SODIUM CHLORIDE 0.9 % IV SOLN
100.0000 mg | Freq: Two times a day (BID) | INTRAVENOUS | Status: DC
  Administered 2024-01-27 – 2024-01-28 (×3): 100 mg via INTRAVENOUS
  Filled 2024-01-27 (×3): qty 100

## 2024-01-27 MED ORDER — POTASSIUM CHLORIDE 10 MEQ/100ML IV SOLN
10.0000 meq | INTRAVENOUS | Status: AC
  Administered 2024-01-27 – 2024-01-28 (×6): 10 meq via INTRAVENOUS
  Filled 2024-01-27 (×5): qty 100

## 2024-01-27 NOTE — Progress Notes (Signed)
 "  NAME:  Lisa Mcknight, MRN:  [id number], DOB:  1945/12/30, LOS: 1 ADMISSION DATE:  01/25/2024, CONSULTATION DATE:  01/26/2024 REFERRING MD:  Benton Shone, MD, CHIEF COMPLAINT:  Respiratory distress   History of Present Illness:  78 y/o female with PMH for Lung cancer s/p Biopsy and RT, Thyroid nodule newly found and not worked up yet, HTN, Weight loss, Osteoporosis, Advanced dementia who has 8 hours home cae, needs to be fed and wears a diaper and is non-verbal with minimal movement during the day as a result advanced Dementia was in her usual state of health and was being fed when she started coughing and then went into respiratory distress.  EMS was called who found her with low O2 sats and gave her Duonebs x 2, 0.3mg  Epi IM and 2 grams of Mg along with O2 en route to ED.  Earlier she had a check up with her doctor and was in her sual sate of health without any urgent issues.  She was in the ED last Saturday for Aspiration event as well but d/c from ED.  Initially BiPAP tried but she was agonal and so she was intubated in ED. WBC 24k, PH 7.21. Pertinent  Medical History  ung cancer s/p Biopsy and RT, Thyroid nodule newly found and not worked up yet, HTN, Weight loss, Osteoporosis, Advanced dementia  Significant Hospital Events: Including procedures, antibiotic start and stop dates in addition to other pertinent events   12/27: admit to ICU  Interim History / Subjective:  CT 12/20: 1. 5.2 cm right hilar mass with severe narrowing of the right middle lobe pulmonary artery and additional mild to moderate narrowing of the right upper and lower lobe pulmonary arteries. Compression or invasion of the right lower and middle lobe bronchi, which are completely occluded. 2. 5.9 cm cyst mass with irregular margins and wall thickening in the right upper posterior chest with destruction of the right posterior 5th rib, measuring 9 x 4.3 cm. 3. Bilateral pulmonary metastases and mediastinal nodal  metastases. 4. Partially visualized indeterminate hypoattenuating right hepatic lobe lesion; metastatic disease is not excluded. 5. Ground glass and tree-in-bud opacities in the lower lungs, likely due to aspiration and/or small airway infection/inflammation. 6. 2.6 cm right thyroid nodule, increased in size compared to 2018; recommend non-emergent thyroid ultrasound if clinically appropriate given comorbidities. 7. Multiple age indeterminate compression fractures in the thoracic spine.  Objective    Blood pressure (!) 130/54, pulse 76, temperature 99 F (37.2 C), resp. rate (!) 24, height 5' 4 (1.626 m), weight 42.5 kg, SpO2 96%.    Vent Mode: CPAP;PSV FiO2 (%):  [55 %-100 %] 55 % PEEP:  [8 cmH20] 8 cmH20 Pressure Support:  [9 cmH20-17 cmH20] 9 cmH20   Intake/Output Summary (Last 24 hours) at 01/27/2024 1215 Last data filed at 01/27/2024 9065 Gross per 24 hour  Intake 4231.58 ml  Output 1900 ml  Net 2331.58 ml   Filed Weights   01/26/24 0244 01/27/24 0400  Weight: 42.5 kg 42.5 kg    Examination: General: elderly woman no distress, asleep HENT: no icterus, supple no JVD Lungs: clear to auscultation bilaterally Cardiovascular: reg s1s2 no murmurs Abdomen: soft, non-tender, BS+ Extremities: no edema  Neuro: PERRL, lightly sedated with precedex    Resolved problem list   Assessment and Plan  Acute hypoxic respiratory failure Aspiration pneumonia - extubated 12/27, on bipap overnight - continue cefepime , doxycycline  and flagyl  - stop vancomycin , MRSA screen negative -f/u tracheal aspirate culture  Metastatic Lung cancer -  Supportive care  Thyroid mass - Work up post extubation  Dysphagia - Swallow eval    Advanced dementia - Supportive care - precedex  as needed for agitation  Osteoporosis - Nutritional/supplemental and supportive care  Palliative care consult placed. Family meeting held today. Plan is to do home hospice. She remains full code at this  time though as family works through decisions.   Labs   CBC: Recent Labs  Lab 01/25/24 2134 01/26/24 0250 01/27/24 0337  WBC 24.1* 13.6* 15.5*  NEUTROABS 16.3*  --  13.7*  HGB 13.2 12.3 8.9*  HCT 43.2 40.5 29.0*  MCV 93.9 94.2 93.5  PLT 403* 326 215    Basic Metabolic Panel: Recent Labs  Lab 01/25/24 2134 01/26/24 0250 01/26/24 0251 01/27/24 0337  NA 145  --  143 138  K 3.5  --  4.4 3.0*  CL 105  --  108 104  CO2 30  --  25 27  GLUCOSE 233*  --  88 376*  BUN 18  --  18 10  CREATININE 0.67 0.52 0.52 0.51  CALCIUM 10.1  --  9.2 8.3*  MG  --   --  2.7* 1.7  PHOS  --   --  4.7* 2.5   GFR: Estimated Creatinine Clearance: 38.3 mL/min (by C-G formula based on SCr of 0.51 mg/dL). Recent Labs  Lab 01/25/24 2134 01/25/24 2317 01/26/24 0250 01/26/24 0351 01/26/24 0715 01/27/24 0337  WBC 24.1*  --  13.6*  --   --  15.5*  LATICACIDVEN  --  2.0*  --  1.8 2.8*  --     Liver Function Tests: Recent Labs  Lab 01/25/24 2134  AST 32  ALT 38  ALKPHOS 185*  BILITOT 0.2  PROT 7.8  ALBUMIN  4.0   No results for input(s): LIPASE, AMYLASE in the last 168 hours. No results for input(s): AMMONIA in the last 168 hours.  ABG    Component Value Date/Time   PHART 7.33 (L) 01/26/2024 0521   PCO2ART 48 01/26/2024 0521   PO2ART 69 (L) 01/26/2024 0521   HCO3 25.3 01/26/2024 0521   TCO2 30 01/13/2011 1408   ACIDBASEDEF 1.1 01/26/2024 0521   O2SAT 98.7 01/26/2024 0521     Coagulation Profile: Recent Labs  Lab 01/25/24 2134  INR 1.0    Cardiac Enzymes: No results for input(s): CKTOTAL, CKMB, CKMBINDEX, TROPONINI in the last 168 hours.  HbA1C: Hgb A1c MFr Bld  Date/Time Value Ref Range Status  01/26/2024 02:50 AM 5.4 4.8 - 5.6 % Final    Comment:    (NOTE) Diagnosis of Diabetes The following HbA1c ranges recommended by the American Diabetes Association (ADA) may be used as an aid in the diagnosis of diabetes mellitus.  Hemoglobin              Suggested A1C NGSP%              Diagnosis  <5.7                   Non Diabetic  5.7-6.4                Pre-Diabetic  >6.4                   Diabetic  <7.0                   Glycemic control for  adults with diabetes.      CBG: Recent Labs  Lab 01/26/24 0816 01/26/24 1157 01/26/24 1624 01/26/24 2014 01/27/24 0013  GLUCAP 129* 135* 120* 117* 112*    Critical care time: 31 minutes   The patient is critically ill with multiple organ system failure and requires high complexity decision making for assessment and support, frequent evaluation and titration of therapies, advanced monitoring, review of radiographic studies and interpretation of complex data.   Critical Care Time devoted to patient care services, exclusive of separately billable procedures, described in this note is  31 minutes.   Dorn Chill, MD [facility] Office: [phone]   See Amion for personal pager PCCM on call pager [phone] until 7pm. Please call Elink 7p-7a. [phone]          "

## 2024-01-27 NOTE — Plan of Care (Signed)
" °  em: Fluid Volume: Goal: Ability to maintain a balanced intake and output will improve Outcome: Progressing   Problem: Nutritional: Goal: Maintenance of adequate nutrition will improve Outcome: Not Progressing   Problem: Skin Integrity: Goal: Risk for impaired skin integrity will decrease Outcome: Progressing   Problem: Safety: Goal: Ability to remain free from injury will improve Outcome: Progressing   "

## 2024-01-27 NOTE — Progress Notes (Signed)
 eLink Physician-Brief Progress Note Patient Name: Lisa Mcknight DOB: 16-Feb-1945 MRN: [id number]   Date of Service  01/27/2024  HPI/Events of Note  Notified that MIVF getting ready to expire, D5LR @ 150.  BSRN reports patient is NPO with hypoglycemic episodes  eICU Interventions  Re-ordered D5 LR but decreased to 75 cc/hr and continue to monitor CBGs Discussed with BSRN     Intervention Category Intermediate Interventions: Other:  [name] 01/27/2024, 4:38 AM

## 2024-01-27 NOTE — Consult Note (Signed)
 "                                                                                   Consultation Note Date: 01/27/2024   Patient Name: Lisa Mcknight  DOB: 1945-12-22  MRN: [id number]  Age / Sex: 78 y.o., female  PCP: Rena Luke POUR, MD Referring Physician: Kara Dorn NOVAK, MD  Reason for Consultation: Establishing goals of care  HPI/Patient Profile: 78 y.o. female admitted on 01/25/2024   Clinical Assessment and Goals of Care: 78 year old lady with lung cancer status postbiopsy and radiation, had cancer care at outside hospital, thyroid nodule newly found not yet worked up, hypertension weight loss osteoporosis and advanced dementia Patient lives at home with her son requires assistance with activities of daily living, requires assistance with feeding and wears a diaper at her baseline is minimally verbal with minimal movement during the day Patient saw her oncologist in September 2025 and was recommended for hospice care Patient brought into the hospital because of cough and respiratory distress and had a recent aspiration event. CT scan on 12-20 with 5.2 cm right hilar mass severe narrowing of right middle lobe pulmonary artery compression invasion of right lower and middle lobe bronchi which are completely occluded 5.9 cm cystic mass irregular margins and wall thickening in the right upper posterior chest with destruction of the right fifth rib Bilateral pulmonary metastases, mediastinal nodal metastases Possible right hepatic lobe metastatic disease Multiple age-indeterminate compression fractures of the thoracic spine At present, patient is on BiPAP remains admitted to critical care service for acute hypoxic respiratory failure aspiration pneumonia in the setting of metastatic lung cancer Palliative consult for ongoing goals of care discussions Chart reviewed Patient seen and examined Discussed with 3 sons present at bedside Palliative medicine is specialized medical care for people  living with serious illness. It focuses on providing relief from the symptoms and stress of a serious illness. The goal is to improve quality of life for both the patient and the family. Goals of care: Broad aims of medical therapy in relation to the patient's values and preferences. Our aim is to provide medical care aimed at enabling patients to achieve the goals that matter most to them, given the circumstances of their particular medical situation and their constraints.    NEXT OF KIN 3 sons, present at bedside, patient lives at home with son Garrel  SUMMARY OF RECOMMENDATIONS   Goals of care discussions undertaken with the patient's 3 children present at bedside.  After meeting with her oncologist in September 2025, the patient did have [facility] hospice consultation at home.  At that time, patient's son reportedly had a reasonable quality of life, had a reasonable functional status and was eating well.  Hence, she did not enroll in hospice services.  She has had a rather acute decline in the last week or week and a half.  We reviewed again about hospice philosophy of care.  We discussed again about the serious incurable nature of the patient's malignancy and CODE STATUS discussions also undertaken.  Patient's 3 sons have varied opinions about the CODE STATUS.  As such, because of her history of osteoporosis, they are not  in favor of CPR.  However, they are in favor of mechanical ventilation, as such, patient remains full code for now we will continue goals of care discussions and CODE STATUS discussions.  Family is on board with proceeding with hospice consultation.  Their hopes are that the patient will be able to be well enough to be released in a few the day so that she can go back home hospice services.  They wish to initiate [facility] hospice services. Will consult TOC.  Continue current mode of care Palliative services to follow Thank you for the consult.  Code Status/Advance Care  Planning: Full code   Symptom Management:     Palliative Prophylaxis:  Frequent Pain Assessment  Additional Recommendations (Limitations, Scope, Preferences): No Artificial Feeding  Psycho-social/Spiritual:  Desire for further Chaplaincy support:yes Additional Recommendations: Education on Hospice  Prognosis:  < 4 weeks  Discharge Planning: Home with Hospice      Primary Diagnoses: Present on Admission:  Acute hypoxic respiratory failure (HCC)   I have reviewed the medical record, interviewed the patient and family, and examined the patient. The following aspects are pertinent.  Past Medical History:  Diagnosis Date   Headache    Hypertension    Memory difficulty    Pneumonia    Tremor    Unsteady gait    Social History   Socioeconomic History   Marital status: Widowed    Spouse name: Not on file   Number of children: 3   Years of education: 12   Highest education level: Bachelor's degree (e.g., BA, AB, BS)  Occupational History   Not on file  Tobacco Use   Smoking status: Former    Current packs/day: 0.00    Types: Cigarettes    Quit date: 05/30/2021    Years since quitting: 2.6   Smokeless tobacco: Never  Vaping Use   Vaping status: Never Used  Substance and Sexual Activity   Alcohol use: Not Currently    Comment: Occasional   Drug use: No   Sexual activity: Never  Other Topics Concern   Not on file  Social History Narrative   Son stays with her every night    Right handed   Social Drivers of Health   Tobacco Use: Medium Risk (01/26/2024)   Patient History    Smoking Tobacco Use: Former    Smokeless Tobacco Use: Never    Passive Exposure: Not on file  Financial Resource Strain: Low Risk (12/24/2023)   Received from [facility]   Overall Financial Resource Strain (CARDIA)    How hard is it for you to pay for the very basics like food, housing, medical care, and heating?: Not hard at all  Food Insecurity: No Food Insecurity (01/26/2024)    Epic    Worried About Radiation Protection Practitioner of Food in the Last Year: Never true    Ran Out of Food in the Last Year: Never true  Transportation Needs: No Transportation Needs (01/26/2024)   Epic    Lack of Transportation (Medical): No    Lack of Transportation (Non-Medical): No  Physical Activity: Inactive (12/24/2023)   Received from [facility]   Exercise Vital Sign    On average, how many days per week do you engage in moderate to strenuous exercise (like a brisk walk)?: 0 days    Minutes of Exercise per Session: Not on file  Stress: No Stress Concern Present (12/24/2023)   Received from [facility] - Occupational Stress  Questionnaire    Do you feel stress - tense, restless, nervous, or anxious, or unable to sleep at night because your mind is troubled all the time - these days?: Only a little  Social Connections: Somewhat Isolated (12/24/2023)   Received from [facility]   Social Network    How would you rate your social network (family, work, friends)?: Restricted participation with some degree of social isolation  Depression (PHQ2-9): Not on file  Alcohol Screen: Not on file  Housing: Low Risk (01/26/2024)   Epic    Unable to Pay for Housing in the Last Year: No    Number of Times Moved in the Last Year: 0    Homeless in the Last Year: No  Utilities: Not At Risk (01/26/2024)   Epic    Threatened with loss of utilities: No  Health Literacy: Not on file   Family History  Problem Relation Age of Onset   Heart disease Father        angina   Heart disease Brother        Atrial fibrillation   Scheduled Meds:  Chlorhexidine  Gluconate Cloth  6 each Topical Daily   docusate  100 mg Per Tube BID   heparin   5,000 Units Subcutaneous Q8H   insulin  aspart  0-9 Units Subcutaneous Q4H   pantoprazole  (PROTONIX ) IV  40 mg Intravenous QHS   polyethylene glycol  17 g Per Tube Daily   Continuous Infusions:  acetaminophen  Stopped (01/26/24  1955)   albumin  human Stopped (01/27/24 0809)   ceFEPime  (MAXIPIME ) IV Stopped (01/27/24 1258)   dexmedetomidine  (PRECEDEX ) IV infusion 0.4 mcg/kg/hr (01/27/24 1309)   dextrose  10 mL/hr at 01/27/24 1309   doxycycline  (VIBRAMYCIN ) IV 125 mL/hr at 01/27/24 1309   metronidazole  Stopped (01/27/24 1306)   norepinephrine  (LEVOPHED ) Adult infusion Stopped (01/26/24 1128)   PRN Meds:.acetaminophen , ondansetron  (ZOFRAN ) IV, sodium chloride  flush Medications Prior to Admission:  Prior to Admission medications  Medication Sig Start Date End Date Taking? Authorizing Provider  amLODipine  (NORVASC ) 5 MG tablet Take 5 mg by mouth daily. 11/03/23  Yes [provider]  amoxicillin -clavulanate (AUGMENTIN ) 875-125 MG tablet Take 1 tablet by mouth every 12 (twelve) hours. 01/19/24  Yes Yolande Lamar BROCKS, MD  benzonatate (TESSALON) 100 MG capsule Take 100 mg by mouth daily.   Yes [provider]  Cholecalciferol (D3 PO) Take 1 tablet by mouth daily.   Yes [provider]  donepezil  (ARICEPT ) 10 MG tablet TAKE 1 TABLET(10 MG) BY MOUTH DAILY WITH BREAKFAST 01/03/24  Yes Penumalli, Vikram R, MD  doxycycline  (VIBRAMYCIN ) 100 MG capsule Take 1 capsule (100 mg total) by mouth 2 (two) times daily for 7 days. 01/19/24 01/27/24 Yes Yolande Lamar BROCKS, MD  hydrochlorothiazide  (HYDRODIURIL ) 25 MG tablet Take 12.5 mg by mouth daily.   Yes [provider]  Multiple Vitamins-Minerals (MULTIVITAMINS THER. W/MINERALS) TABS Take 1 tablet by mouth daily.     Yes [provider]  omeprazole (PRILOSEC) 20 MG capsule Take 20 mg by mouth daily as needed (Indigestion). 12/24/23  Yes [provider]  sertraline (ZOLOFT) 25 MG tablet Take 25 mg by mouth daily. 11/04/23  Yes [provider]  carbidopa -levodopa  (SINEMET  IR) 25-100 MG tablet Take half a pill twice daily (8 AM and noon) for one week, then one pill twice daily Patient not taking: Reported on 01/27/2024 10/30/22    Rush Nest, MD   Allergies[1] Review of Systems On BiPAP Physical Exam Weak appearing lady On BiPAP Monitor noted Moves  extremities No peripheral edema  Vital Signs: BP (!) 98/46   Pulse 76   Temp 99.1 F (37.3 C)   Resp 13   Ht 5' 4 (1.626 m)   Wt 42.5 kg   SpO2 98%   BMI 16.08 kg/m  Pain Scale: CPOT       SpO2: SpO2: 98 % O2 Device:SpO2: 98 % O2 Flow Rate: .O2 Flow Rate (L/min): 8 L/min  IO: Intake/output summary:  Intake/Output Summary (Last 24 hours) at 01/27/2024 1324 Last data filed at 01/27/2024 1309 Gross per 24 hour  Intake 4564.87 ml  Output 1900 ml  Net 2664.87 ml    LBM: Last BM Date : 01/27/24 Baseline Weight: Weight: 42.5 kg Most recent weight: Weight: 42.5 kg     Palliative Assessment/Data:   Palliative scale 40%  Time In: Noon Time Out: 1.15 Time Total: 75 Greater than 50%  of this time was spent counseling and coordinating care related to the above assessment and plan.  Signed by: Lonia Serve, MD   Please contact Palliative Medicine Team phone at [phone] for questions and concerns.  For individual provider: See Amion                 [1] No Known Allergies  "

## 2024-01-27 NOTE — Plan of Care (Signed)
  Problem: Coping: Goal: Ability to adjust to condition or change in health will improve Outcome: Not Progressing

## 2024-01-27 NOTE — Progress Notes (Signed)
 PT Cancellation Note  Patient Details Name: Lisa Mcknight MRN: [id number] DOB: [birth year]   Cancelled Treatment:     PT order received but eval deferred at request of RN 2* low BP.  Will follow.   Gage Weant 01/27/2024, 2:41 PM

## 2024-01-27 NOTE — Progress Notes (Signed)
 eLink Physician-Brief Progress Note Patient Name: Lisa Mcknight DOB: [birth year] MRN: [id number]   Date of Service  01/27/2024  HPI/Events of Note  Patient continous to be  anxious on BIPAP even after using her Fentanyl   PRN bolus' . Keeps pulling off BIPAP and desats from the bag. Asking if Fentanyl  can be changed to IVP.  eICU Interventions  Start Precedex  instead to avoid significant respiratory depression Discussed with BSRN     Intervention Category Minor Interventions: Agitation / anxiety - evaluation and management  [name] 01/27/2024, 3:21 AM

## 2024-01-28 DIAGNOSIS — C7800 Secondary malignant neoplasm of unspecified lung: Secondary | ICD-10-CM | POA: Diagnosis not present

## 2024-01-28 DIAGNOSIS — R1310 Dysphagia, unspecified: Secondary | ICD-10-CM | POA: Diagnosis not present

## 2024-01-28 DIAGNOSIS — J9601 Acute respiratory failure with hypoxia: Secondary | ICD-10-CM | POA: Diagnosis not present

## 2024-01-28 DIAGNOSIS — J690 Pneumonitis due to inhalation of food and vomit: Secondary | ICD-10-CM | POA: Diagnosis not present

## 2024-01-28 LAB — COMPREHENSIVE METABOLIC PANEL WITH GFR
ALT: 21 U/L (ref 0–44)
AST: 30 U/L (ref 15–41)
Albumin: 3.9 g/dL (ref 3.5–5.0)
Alkaline Phosphatase: 134 U/L — ABNORMAL HIGH (ref 38–126)
Anion gap: 13 (ref 5–15)
BUN: 16 mg/dL (ref 8–23)
CO2: 23 mmol/L (ref 22–32)
Calcium: 9.9 mg/dL (ref 8.9–10.3)
Chloride: 109 mmol/L (ref 98–111)
Creatinine, Ser: 0.62 mg/dL (ref 0.44–1.00)
GFR, Estimated: >60 mL/min
Glucose, Bld: 86 mg/dL (ref 70–99)
Potassium: 3.7 mmol/L (ref 3.5–5.1)
Sodium: 145 mmol/L (ref 135–145)
Total Bilirubin: 0.7 mg/dL (ref 0.0–1.2)
Total Protein: 6.1 g/dL — ABNORMAL LOW (ref 6.5–8.1)

## 2024-01-28 LAB — PHOSPHORUS: Phosphorus: 1.4 mg/dL — ABNORMAL LOW (ref 2.5–4.6)

## 2024-01-28 LAB — GLUCOSE, CAPILLARY
Glucose-Capillary: 83 mg/dL (ref 70–99)
Glucose-Capillary: 80 mg/dL (ref 70–99)

## 2024-01-28 LAB — MAGNESIUM: Magnesium: 2.3 mg/dL (ref 1.7–2.4)

## 2024-01-28 MED ORDER — HYDROMORPHONE HCL 1 MG/ML IJ SOLN
0.5000 mg | INTRAMUSCULAR | Status: DC | PRN
  Administered 2024-01-28: 1 mg via INTRAVENOUS
  Filled 2024-01-28: qty 1

## 2024-01-28 MED ORDER — ACETAMINOPHEN 650 MG RE SUPP: 650.0000 mg | Freq: Four times a day (QID) | RECTAL | Status: DC | PRN

## 2024-01-28 MED ORDER — GLYCOPYRROLATE 1 MG PO TABS: 1.0000 mg | ORAL_TABLET | ORAL | Status: DC | PRN

## 2024-01-28 MED ORDER — HYDROMORPHONE HCL 1 MG/ML IJ SOLN
0.5000 mg | INTRAMUSCULAR | Status: DC | PRN
  Administered 2024-01-28: 0.5 mg via INTRAVENOUS
  Filled 2024-01-28: qty 1

## 2024-01-28 MED ORDER — ACETAMINOPHEN 325 MG PO TABS: 650.0000 mg | ORAL_TABLET | Freq: Four times a day (QID) | ORAL | Status: DC | PRN

## 2024-01-28 MED ORDER — LORAZEPAM 2 MG/ML IJ SOLN
0.5000 mg | INTRAMUSCULAR | Status: DC | PRN
  Administered 2024-01-28: 0.5 mg via INTRAVENOUS
  Filled 2024-01-28: qty 1

## 2024-01-28 MED ORDER — GLYCOPYRROLATE 0.2 MG/ML IJ SOLN
0.2000 mg | INTRAMUSCULAR | Status: DC | PRN
  Administered 2024-01-28: 0.2 mg via INTRAVENOUS
  Filled 2024-01-28: qty 1

## 2024-01-28 MED ORDER — GLYCOPYRROLATE 0.2 MG/ML IJ SOLN: 0.2000 mg | INTRAMUSCULAR | Status: DC | PRN

## 2024-01-28 MED ORDER — FUROSEMIDE 10 MG/ML IJ SOLN
20.0000 mg | Freq: Once | INTRAMUSCULAR | Status: AC
  Administered 2024-01-28: 20 mg via INTRAVENOUS
  Filled 2024-01-28: qty 2

## 2024-01-28 MED ORDER — POTASSIUM PHOSPHATES 15 MMOLE/5ML IV SOLN
30.0000 mmol | Freq: Once | INTRAVENOUS | Status: DC
  Filled 2024-01-28: qty 10

## 2024-01-28 MED ORDER — POLYVINYL ALCOHOL 1.4 % OP SOLN: 1.0000 [drp] | Freq: Four times a day (QID) | OPHTHALMIC | Status: DC | PRN

## 2024-01-31 LAB — CULTURE, BLOOD (ROUTINE X 2)
Culture: NO GROWTH
Culture: NO GROWTH
Special Requests: ADEQUATE
Special Requests: ADEQUATE

## 2024-01-31 NOTE — Progress Notes (Signed)
 "  NAME:  Margurette Brener, MRN:  [id number], DOB:  08/01/1945, LOS: 2 ADMISSION DATE:  01/25/2024, CONSULTATION DATE:  01/26/2024 REFERRING MD:  Benton Shone, MD, CHIEF COMPLAINT:  Respiratory distress   History of Present Illness:  79 y/o female with PMH for Lung cancer s/p Biopsy and RT, Thyroid nodule newly found and not worked up yet, HTN, Weight loss, Osteoporosis, Advanced dementia who has 8 hours home cae, needs to be fed and wears a diaper and is non-verbal with minimal movement during the day as a result advanced Dementia was in her usual state of health and was being fed when she started coughing and then went into respiratory distress.  EMS was called who found her with low O2 sats and gave her Duonebs x 2, 0.3mg  Epi IM and 2 grams of Mg along with O2 en route to ED.  Earlier she had a check up with her doctor and was in her sual sate of health without any urgent issues.  She was in the ED last Saturday for Aspiration event as well but d/c from ED.  Initially BiPAP tried but she was agonal and so she was intubated in ED. WBC 24k, PH 7.21. Pertinent  Medical History  ung cancer s/p Biopsy and RT, Thyroid nodule newly found and not worked up yet, HTN, Weight loss, Osteoporosis, Advanced dementia  Significant Hospital Events: Including procedures, antibiotic start and stop dates in addition to other pertinent events   12/27: admit to ICU, extubated 12/28: increase secretion burden, intermittent bipap. Palliative care consult.  Interim History / Subjective:   Placed back on bipap overnight, removed this morning with significant secretion burden.   Sons at bedside, discussed recommendations for transfer to inpatient hospice center vs transition to inpatient comfort care.   Objective    Blood pressure (!) 107/52, pulse 99, temperature (!) 100.4 F (38 C), resp. rate (!) 30, height 5' 4 (1.626 m), weight 48.8 kg, SpO2 94%.    FiO2 (%):  [50 %-100 %] 80 % PEEP:  [8 cmH20] 8  cmH20 Pressure Support:  [9 cmH20] 9 cmH20   Intake/Output Summary (Last 24 hours) at 2024/02/14 0737 Last data filed at 2024/02/14 0147 Gross per 24 hour  Intake 2181.73 ml  Output 525 ml  Net 1656.73 ml   Filed Weights   01/26/24 0244 01/27/24 0400 February 14, 2024 0700  Weight: 42.5 kg 42.5 kg 48.8 kg    Examination: General: elderly woman, intermittent respiratory distress HENT: no icterus, supple no JVD Lungs: diminished on right, clear on left. Productive cough with thick secretions. Needing frequent suctioning Cardiovascular: reg s1s2 no murmurs Abdomen: soft, non-tender, BS+ Extremities: no edema  Neuro: PERRL, moving extremities   Resolved problem list   Assessment and Plan  Acute hypoxic respiratory failure Aspiration pneumonia - extubated 12/27, on intermittent bipap since - continue cefepime , doxycycline  and flagyl  - continue frequent oral and NT suctioning to aid in secretion clearance  Metastatic Lung cancer - Supportive care, oncology has recommended hospice care  Thyroid mass - work up deferred at this time  Dysphagia - on going issues managing secretions  Advanced dementia - Supportive care - precedex  as needed for agitation  Osteoporosis - Nutritional/supplemental and supportive care  Hypophosphatemia - replete  GOC: Palliative care following. On going discussions on care options.  Labs   CBC: Recent Labs  Lab 01/25/24 2134 01/26/24 0250 01/27/24 0337  WBC 24.1* 13.6* 15.5*  NEUTROABS 16.3*  --  13.7*  HGB 13.2 12.3 8.9*  HCT  43.2 40.5 29.0*  MCV 93.9 94.2 93.5  PLT 403* 326 215    Basic Metabolic Panel: Recent Labs  Lab 01/25/24 2134 01/26/24 0250 01/26/24 0251 01/27/24 0337  NA 145  --  143 138  K 3.5  --  4.4 3.0*  CL 105  --  108 104  CO2 30  --  25 27  GLUCOSE 233*  --  88 376*  BUN 18  --  18 10  CREATININE 0.67 0.52 0.52 0.51  CALCIUM 10.1  --  9.2 8.3*  MG  --   --  2.7* 1.7  PHOS  --   --  4.7* 2.5    GFR: Estimated Creatinine Clearance: 43.9 mL/min (by C-G formula based on SCr of 0.51 mg/dL). Recent Labs  Lab 01/25/24 2134 01/25/24 2317 01/26/24 0250 01/26/24 0351 01/26/24 0715 01/27/24 0337  WBC 24.1*  --  13.6*  --   --  15.5*  LATICACIDVEN  --  2.0*  --  1.8 2.8*  --     Liver Function Tests: Recent Labs  Lab 01/25/24 2134  AST 32  ALT 38  ALKPHOS 185*  BILITOT 0.2  PROT 7.8  ALBUMIN  4.0   No results for input(s): LIPASE, AMYLASE in the last 168 hours. No results for input(s): AMMONIA in the last 168 hours.  ABG    Component Value Date/Time   PHART 7.33 (L) 01/26/2024 0521   PCO2ART 48 01/26/2024 0521   PO2ART 69 (L) 01/26/2024 0521   HCO3 25.3 01/26/2024 0521   TCO2 30 01/13/2011 1408   ACIDBASEDEF 1.1 01/26/2024 0521   O2SAT 98.7 01/26/2024 0521     Coagulation Profile: Recent Labs  Lab 01/25/24 2134  INR 1.0    Cardiac Enzymes: No results for input(s): CKTOTAL, CKMB, CKMBINDEX, TROPONINI in the last 168 hours.  HbA1C: Hgb A1c MFr Bld  Date/Time Value Ref Range Status  01/26/2024 02:50 AM 5.4 4.8 - 5.6 % Final    Comment:    (NOTE) Diagnosis of Diabetes The following HbA1c ranges recommended by the American Diabetes Association (ADA) may be used as an aid in the diagnosis of diabetes mellitus.  Hemoglobin             Suggested A1C NGSP%              Diagnosis  <5.7                   Non Diabetic  5.7-6.4                Pre-Diabetic  >6.4                   Diabetic  <7.0                   Glycemic control for                       adults with diabetes.      CBG: Recent Labs  Lab 01/26/24 2014 01/27/24 0013 01/27/24 2016 01/27/24 2317 February 22, 2024 0357  GLUCAP 117* 112* 89 83 83    Critical care time: 31 minutes   The patient is critically ill with multiple organ system failure and requires high complexity decision making for assessment and support, frequent evaluation and titration of therapies, advanced  monitoring, review of radiographic studies and interpretation of complex data.   Critical Care Time devoted to patient care services, exclusive of separately billable procedures, described  in this note is  31 minutes.   Dorn Chill, MD [facility] Office: [phone]   See Amion for personal pager PCCM on call pager [phone] until 7pm. Please call Elink 7p-7a. [phone]          "

## 2024-01-31 NOTE — Progress Notes (Signed)
 Chaplain responded to spiritual care consult. Comfort care.  Sons Redell and Ozell welcomed me into the room and shared their thoughts and emotions regarding pt Lisa Mcknight's decline. Both have been heavily involved in her care, especially over the last couple of years. Their primary struggle is in coming to terms with the reality of the situation, and certain feelings of regret. At the same time, they are grateful to have been able to dedicate more time and presence to her and that she was able to enjoy a happy Thanksgiving and Christmas. I normalized all of these emotions and provided support via reflective listening, compassionate presence and grief education.  Redell and Ozell were also agreeable to my contacting Demesha's Catholic priest from [facility] in [place] to visit and perform an games developer. He is expected to stop by today.

## 2024-01-31 NOTE — TOC Initial Note (Signed)
 Transition of Care [facility]) - Initial/Assessment Note    Patient Details  Name: Lisa Mcknight MRN: [id number] Date of Birth: August 08, 1945  Transition of Care [facility]) CM/SW Contact:    Jon ONEIDA Anon, RN Phone Number: 2024/02/17, 11:14 AM  Clinical Narrative:                 Received consult for inpatient hospice facility with [facility]. RNCM sent referral to Eleanor Nail, hospital liaison with [facility] and she has accepted referral. Pt will need ambulance transport set up. Will follow for DC planning needs.    Expected Discharge Plan: Hospice Medical Facility Barriers to Discharge: Other (must enter comment) (Awaiting inpatient hospice bed)   Patient Goals and CMS Choice Patient states their goals for this hospitalization and ongoing recovery are:: Pt sons stating wanting to go to inpatient hospice at [facility] Medicare.gov Compare Post Acute Care list provided to:: Patient Represenative (must comment) (Pt son) Choice offered to / list presented to : Adult Children [place] ownership interest in [facility].provided to:: Adult Children    Expected Discharge Plan and Services In-house Referral: Hospice / Palliative Care Discharge Planning Services: CM Consult Post Acute Care Choice: Residential Hospice Bed Living arrangements for the past 2 months: Single Family Home                 DME Arranged: N/A DME Agency: NA       HH Arranged: NA HH Agency: [facility] Date HH Agency Contacted: 02-17-24 Time HH Agency Contacted: 1030 Representative spoke with at [facility] Agency: Eleanor Nail  Prior Living Arrangements/Services Living arrangements for the past 2 months: Single Family Home Lives with:: Self Patient language and need for interpreter reviewed:: Yes Do you feel safe going back to the place where you live?: Yes      Need for Family Participation in Patient Care: Yes (Comment) Care giver support system in place?: Yes (comment) Current  home services: DME Criminal Activity/Legal Involvement Pertinent to Current Situation/Hospitalization: No - Comment as needed  Activities of Daily Living   ADL Screening (condition at time of admission) Independently performs ADLs?: Yes (appropriate for developmental age) Is the patient deaf or have difficulty hearing?: No Does the patient have difficulty seeing, even when wearing glasses/contacts?: No Does the patient have difficulty concentrating, remembering, or making decisions?: Yes  Permission Sought/Granted Permission sought to share information with : Family Supports    Share Information with NAME: [name], [place], Emergency Contact  [phone]           Emotional Assessment Appearance:: Other (Comment Required (UTA) Attitude/Demeanor/Rapport: Unable to Assess Affect (typically observed): Unable to Assess   Alcohol / Substance Use: Not Applicable Psych Involvement: No (comment)  Admission diagnosis:  Respiratory arrest (HCC) [R09.2] Acute respiratory failure with hypoxia and hypercapnia (HCC) [J96.01, J96.02] Aspiration pneumonia, unspecified aspiration pneumonia type, unspecified laterality, unspecified part of lung (HCC) [J69.0] Acute hypoxic respiratory failure (HCC) [J96.01] Patient Active Problem List   Diagnosis Date Noted   Acute hypoxic respiratory failure (HCC) 01/26/2024   SOB (shortness of breath) 07/07/2016   Tachycardia 07/07/2016   Pleural effusion 07/07/2016   Thyroid mass 07/07/2016   Acute diastolic congestive heart failure (HCC) 01/20/2011   Hypertension 01/13/2011   Acute respiratory failure (HCC) 01/13/2011   Community acquired bacterial pneumonia 01/13/2011   Ex-smoker 01/13/2011   Hyponatremia 01/13/2011   PCP:  Rena Luke POUR, MD Pharmacy:   [facility] [phone] [place] [place], [place] -  [address] AT [facility] [address] [place] [address] Phone: [phone] Fax: [phone]     Social  Drivers of Health (SDOH) Social History: SDOH Screenings   Food Insecurity: No Food Insecurity (01/26/2024)  Housing: Low Risk (01/26/2024)  Transportation Needs: No Transportation Needs (01/26/2024)  Utilities: Not At Risk (01/26/2024)  Financial Resource Strain: Low Risk (12/24/2023)   Received from [facility]  Physical Activity: Inactive (12/24/2023)   Received from [facility]  Social Connections: Somewhat Isolated (12/24/2023)   Received from [facility]  Stress: No Stress Concern Present (12/24/2023)   Received from [facility]  Tobacco Use: Medium Risk (01/26/2024)   SDOH Interventions:     Readmission Risk Interventions    2024-02-05   10:54 AM  Readmission Risk Prevention Plan  Transportation Screening Complete  PCP or Specialist Appt within 5-7 Days Complete  Home Care Screening Complete  Medication Review (RN CM) Complete

## 2024-01-31 NOTE — Progress Notes (Signed)
 "                                                                                                                                                                                                          Daily Progress Note   Patient Name: Lisa Mcknight       Date: 02/16/2024 DOB: 04-10-1945  Age: 79 y.o. MRN#: [id number] Attending Physician: Kara Dorn NOVAK, MD Primary Care Physician: Rena Luke POUR, MD Admit Date: 01/25/2024  Reason for Consultation/Follow-up: Establishing goals of care  Subjective: Patient appears in marked respiratory distress, using accessory muscles of respiration, has nonverbal gestures of distress and discomfort evident.  2 sons at bedside.  Goals of care and CODE STATUS discussions undertaken alongside nursing colleague also present in the room, see below.  Length of Stay: 2  Current Medications: Scheduled Meds:   Chlorhexidine  Gluconate Cloth  6 each Topical Daily   docusate  100 mg Per Tube BID   insulin  aspart  0-9 Units Subcutaneous Q4H   polyethylene glycol  17 g Per Tube Daily    Continuous Infusions:  albumin  human 60 mL/hr at 02/16/24 0930   ceFEPime  (MAXIPIME ) IV Stopped (2024/02/16 9076)   dexmedetomidine  (PRECEDEX ) IV infusion 0.6 mcg/kg/hr (16-Feb-2024 0930)   dextrose  10 mL/hr at 2024/02/16 0930   doxycycline  (VIBRAMYCIN ) IV 100 mg (2024-02-16 0947)   metronidazole  Stopped (02/16/2024 0032)   potassium PHOSPHATE IVPB (in mmol)      PRN Meds: HYDROmorphone  (DILAUDID ) injection, LORazepam , ondansetron  (ZOFRAN ) IV, sodium chloride  flush  Physical Exam         Was on BiPAP earlier, at present on Ventimask Elderly woman intermittent respiratory distress intermittent Monitor noted Coarse breath sounds Awake but not alert No peripheral edema  Vital Signs: BP (!) 105/41   Pulse 78   Temp 98.8 F (37.1 C)   Resp (!) 31   Ht 5' 4 (1.626 m)   Wt 48.8 kg   SpO2 (!) 88%   BMI 18.47 kg/m  SpO2: SpO2: (!) 88 % O2 Device: O2 Device: High Flow  Nasal Cannula (salter) O2 Flow Rate: O2 Flow Rate (L/min): 15 L/min  Intake/output summary:  Intake/Output Summary (Last 24 hours) at 02/16/24 1047 Last data filed at 02/16/24 0930 Gross per 24 hour  Intake 2106.67 ml  Output 525 ml  Net 1581.67 ml   LBM: Last BM Date : 02-16-2024 Baseline Weight: Weight: 42.5 kg Most recent weight: Weight: 48.8 kg       Palliative Assessment/Data:      Patient Active Problem List  Diagnosis Date Noted   Acute hypoxic respiratory failure (HCC) 01/26/2024   SOB (shortness of breath) 07/07/2016   Tachycardia 07/07/2016   Pleural effusion 07/07/2016   Thyroid mass 07/07/2016   Acute diastolic congestive heart failure (HCC) 01/20/2011   Hypertension 01/13/2011   Acute respiratory failure (HCC) 01/13/2011   Community acquired bacterial pneumonia 01/13/2011   Ex-smoker 01/13/2011   Hyponatremia 01/13/2011    Palliative Care Assessment & Plan   Patient Profile:    Assessment: Acute hypoxic respiratory failure Aspiration pneumonia Metastatic lung cancer Thyroid mass Dysphagia Advanced dementia Osteoporosis  Recommendations/Plan: CODE STATUS and goals of care discussions undertaken again.  Discussed with nursing colleagues, discussed with PCCM MD.  2 sons present at bedside.  We reviewed about the serious and incurable nature of the patient's condition, we discussed about patient's current acute symptom crisis of generalized pain shortness of breath and air hunger as is evident from her nonverbal gestures.  Disposition options discussed.  Explained about hospice philosophy of care.  We compared and contrasted starting comfort measures here in the hospital to the type of care that can be given inside a residential hospice facility. Plan: DNR/DNI comfort measures Add low-dose IV hydromorphone  as well as IV Ativan  to be used on an as needed basis, discussed with family present at bedside with regards to the appropriate use of opioids and  benzodiazepines for symptom management of pain, shortness of breath and air hunger at this point. Family wishes to pursue residential hospice if bed can be arranged quickly otherwise will continue with comfort measures here in the hospital.  At present, continue with antibiotics and Precedex  until a decision with regards to hospice facility bed availability is determined and until another son can also arrive at bedside. Recommend chaplain consult for additional support.    Code Status:    Code Status Orders  (From admission, onward)           Start     Ordered   2024-02-14 1047  Do not attempt resuscitation (DNR) - Comfort care  (Code Status)  Continuous       Question Answer Comment  If patient has no pulse and is not breathing Do Not Attempt Resuscitation   In Pre-Arrest Conditions (Patient Is Breathing and Has a Pulse) Provide comfort measures. Relieve any mechanical airway obstruction. Avoid transfer unless required for comfort.   Consent: Discussion documented in EHR or advanced directives reviewed      14-Feb-2024 1047           Code Status History     Date Active Date Inactive Code Status Order ID Comments User Context   01/26/2024 0043 14-Feb-2024 1047 Full Code [id number]  Maree Harder, MD ED   07/07/2016 2237 07/09/2016 2026 Full Code [id number]  Pearlean Tully BRAVO, MD Inpatient       Prognosis:  Hours - Days  Discharge Planning: Anticipated Hospital Death  Care plan was discussed with patient's 2 sons present at bedside alongside PCCM RN.  Additionally, discussed with the Dr. Kara from [facility], also coordinating care with [facility] colleagues.  Thank you for allowing the Palliative Medicine Team to assist in the care of this patient. High MDM.      Greater than 50%  of this time was spent counseling and coordinating care related to the above assessment and plan.  Lonia Serve, MD  Please contact Palliative Medicine Team phone at [phone] for questions and concerns.        "

## 2024-01-31 NOTE — Progress Notes (Signed)
 Time of death 1550. Confirmed no spontaneous lung sounds or heart sounds by this RN & Madailein F RN. Family at bedside.

## 2024-01-31 NOTE — Death Summary Note (Signed)
 " DEATH SUMMARY   Patient Details  Name: Lisa Mcknight MRN: [id number] DOB: March 02, 1945  Admission/Discharge Information   Admit Date:  01/13/2024  Date of Death: Date of Death: 2024/01/31  Time of Death: Time of Death: 1550  Length of Stay: 2  Referring Physician: Rena Luke POUR, MD   Reason(s) for Hospitalization  Aspiration Pneumonia  Diagnoses  Preliminary cause of death:  Sepsis due to pneumonia  Secondary Diagnoses (including complications and co-morbidities):  Principal Problem:   Acute hypoxic respiratory failure (HCC) Aspiration Pneumonia Metastatic Lung Cancer Thyroid Mass Dysphagia Advanced Dementia   Brief Hospital Course (including significant findings, care, treatment, and services provided and events leading to death)  Lisa Mcknight is a 79 y.o. year old female with history of metastatic lung cancer, dementia, dysphagia who was admitted for acute hypoxemic respiratory failure due to aspiration pneumonia. She was intubated on admission and extubated 12/27 after passing breathing trial. She had on going respiratory failure issues due to secretion burden that she was not able to clear her self. She required frequent suctioning and intermittent bipap. Palliative care consulted and family meetings held. Given her ongoing decline comfort care was recommended. She transitioned to inpatient comfort care and passed away Jan 31, 2024 at 1550.   Pertinent Labs and Studies  Significant Diagnostic Studies DG CHEST PORT 1 VIEW Result Date: 01/27/2024 CLINICAL DATA:  Respiratory failure. EXAM: PORTABLE CHEST 1 VIEW COMPARISON:  Radiograph yesterday.  CT 01/19/2024 FINDINGS: Improvement in multifocal right lung opacities. Partially loculated right pleural effusion, diminishing from yesterday's exam. Rounded opacity persists in the periphery of the mid upper lung zone. Right hilar enlargement is again seen. Background interstitial coarsening. Improving left lung base or a shin with  decreased opacity and pleural effusion. Known left lung base nodule not well demonstrated by radiograph. IMPRESSION: 1. Improvement in multifocal right lung opacities. Partially loculated right pleural effusion, diminishing from yesterday's exam. 2. Improving left lung base aeration with decreased opacity and pleural effusion. Electronically Signed   By: Andrea Gasman M.D.   On: 01/27/2024 16:39   DG CHEST PORT 1 VIEW Result Date: 01/26/2024 CLINICAL DATA:  Respiratory failure. EXAM: PORTABLE CHEST 1 VIEW COMPARISON:  Radiograph yesterday.  CT 01/19/2024 FINDINGS: Interval extubation. Rounded opacity in the periphery of the right lung is again seen. Significant progression in multifocal right lung opacities and right pleural effusion. Right hilar prominence with hilar mass/adenopathy on CT. The left lung nodule on CT is not well demonstrated on the current exam. Increasing left lung base opacity and probable left pleural effusion. No visible pneumothorax. IMPRESSION: 1. Interval extubation. 2. Significant progression in multifocal right lung opacities and right pleural effusion. 3. Increasing left lung base opacity and probable left pleural effusion. 4. Right hilar prominence with hilar mass/adenopathy on CT. Electronically Signed   By: Andrea Gasman M.D.   On: 01/26/2024 17:02   ECHOCARDIOGRAM LIMITED Result Date: 01/26/2024    ECHOCARDIOGRAM LIMITED REPORT   Patient Name:   Lisa Mcknight Date of Exam: 01/26/2024 Medical Rec #:  [id number]      Height:       64.0 in Accession #:    [id number]     Weight:       93.7 lb Date of Birth:  1945/05/13       BSA:          1.417 m Patient Age:    79 years       BP:  107/52 mmHg Patient Gender: F              HR:           93 bpm. Exam Location:  Inpatient Procedure: Limited Echo, Cardiac Doppler and Color Doppler (Both Spectral and            Color Flow Doppler were utilized during procedure). Indications:    Shock  History:        Patient has no prior  history of Echocardiogram examinations.                 Risk Factors:Hypertension.  Sonographer:    Philomena Daring Referring Phys: [id number] DORN KATHEE CHILL  Sonographer Comments: Technically difficult study due to poor echo windows. Image acquisition challenging due to patient behavioral factors. IMPRESSIONS  1. Left ventricular ejection fraction, by estimation, is 45 to 50%. The left ventricle has mildly decreased function. The left ventricle demonstrates global hypokinesis. Left ventricular diastolic parameters were normal.  2. Right ventricular systolic function is hyperdynamic. The right ventricular size is normal.  3. Calcified chordal apparatus. The mitral valve is abnormal. Trivial mitral valve regurgitation. No evidence of mitral stenosis. There is mild holosystolic prolapse of the middle segment of the anterior leaflet of the mitral valve.  4. The aortic valve was not well visualized. Aortic valve regurgitation is not visualized. No aortic stenosis is present. Comparison(s): No prior Echocardiogram. FINDINGS  Left Ventricle: Left ventricular ejection fraction, by estimation, is 45 to 50%. The left ventricle has mildly decreased function. The left ventricle demonstrates global hypokinesis. Left ventricular diastolic parameters were normal. Right Ventricle: The right ventricular size is normal. No increase in right ventricular wall thickness. Right ventricular systolic function is hyperdynamic. Mitral Valve: Calcified chordal apparatus. The mitral valve is abnormal. There is mild holosystolic prolapse of the middle segment of the anterior leaflet of the mitral valve. Trivial mitral valve regurgitation. No evidence of mitral valve stenosis. Tricuspid Valve: The tricuspid valve is normal in structure. Tricuspid valve regurgitation is not demonstrated. No evidence of tricuspid stenosis. Aortic Valve: The aortic valve was not well visualized. Aortic valve regurgitation is not visualized. No aortic stenosis is  present. Aortic valve mean gradient measures 8.3 mmHg. Aortic valve peak gradient measures 14.6 mmHg. Pulmonic Valve: The pulmonic valve was not well visualized. Pulmonic valve regurgitation is not visualized. No evidence of pulmonic stenosis. Venous: IVC assessment for right atrial pressure unable to be performed due to mechanical ventilation. Additional Comments: Spectral Doppler performed. Color Doppler performed.   LV Volumes (MOD) LV vol d, MOD A2C: 73.2 ml Diastology LV vol d, MOD A4C: 78.2 ml LV e' medial:    6.20 cm/s LV vol s, MOD A2C: 37.5 ml LV E/e' medial:  16.6 LV vol s, MOD A4C: 43.0 ml LV e' lateral:   11.40 cm/s LV SV MOD A2C:     35.7 ml LV E/e' lateral: 9.0 LV SV MOD A4C:     78.2 ml LV SV MOD BP:      35.7 ml RIGHT VENTRICLE             IVC RV S prime:     15.10 cm/s  IVC diam: 2.10 cm TAPSE (M-mode): 2.2 cm AORTIC VALVE AV Vmax:           191.33 cm/s AV Vmean:          132.000 cm/s AV VTI:            0.298 m AV Peak  Grad:      14.6 mmHg AV Mean Grad:      8.3 mmHg LVOT Vmax:         130.00 cm/s LVOT Vmean:        90.000 cm/s LVOT VTI:          0.222 m LVOT/AV VTI ratio: 0.75 MITRAL VALVE                TRICUSPID VALVE MV Area (PHT): 4.63 cm     TR Peak grad:   23.8 mmHg MV Decel Time: 164 msec     TR Vmax:        244.00 cm/s MV E velocity: 103.00 cm/s MV A velocity: 129.00 cm/s  SHUNTS MV E/A ratio:  0.80         Systemic VTI: 0.22 m Stanly Leavens MD Electronically signed by Stanly Leavens MD Signature Date/Time: 01/26/2024/3:05:20 PM    Final    DG Abd 1 View Result Date: 01/26/2024 CLINICAL DATA:  Nasogastric tube placement. EXAM: ABDOMEN - 1 VIEW COMPARISON:  January 26, 2024 FINDINGS: Enteric tube is seen with its distal tip overlying the body of the stomach. The distal side hole sits approximately 3.0 cm distal to the expected region of the gastroesophageal junction. Bibasilar interstitial lung disease and right upper lobe airspace disease is again seen. The bowel gas  pattern is normal within the visualized portion of the upper abdomen. No radio-opaque calculi or other significant radiographic abnormality are seen. IMPRESSION: Enteric tube positioning, as described above. Electronically Signed   By: Suzen Dials M.D.   On: 01/26/2024 11:08   DG Abd Portable 1V Result Date: 01/26/2024 CLINICAL DATA:  OG tube placement. EXAM: PORTABLE ABDOMEN - 1 VIEW COMPARISON:  None Available. FINDINGS: The tip of the OG tube is justr into the stomach with proximal side port of the OG tube at the local distal esophagus. A G tube could be advanced 6-7 cm for proximal side port placement below the GE junction. Chronic interstitial changes overlie the lung bases. Abdominal bowel gas pattern is nonspecific. IMPRESSION: The tip of the OG tube is just into the stomach with proximal side port of the OG tube at the distal esophagus. OG-tube could be advanced 6-7 cm for proximal side port placement below the GE junction. Repeat x-ray after repositioning recommended. Electronically Signed   By: Camellia Candle M.D.   On: 01/26/2024 06:56   DG Chest Port 1 View Result Date: 01/25/2024 EXAM: 1 VIEW(S) XRAY OF THE CHEST 01/25/2024 10:15:00 PM COMPARISON: 01/19/2024 CLINICAL HISTORY: Questionable sepsis - evaluate for abnormality FINDINGS: LINES, TUBES AND DEVICES: Endotracheal tube in place with tip 4.5 cm above the carina. LUNGS AND PLEURA: Stable right upper lobe peripheral rounded pleural-based mass, pleural thickening, and right hilar mass. These findings were better evaluated on CT examination of 01/19/2024. Stable diffuse interstitial coarsening. Persistent small right pleural effusion. Known bilateral intrapulmonary metastatic disease is not well appreciated on this examination. No pneumothorax. HEART AND MEDIASTINUM: Aortic atherosclerotic calcification. BONES AND SOFT TISSUES: Destruction of the right 5th rib posterolaterally. IMPRESSION: 1. Endotracheal tube in place with tip 4.5 cm  above the carina. 2. Persistent small right pleural effusion. 3. Stable right upper lobe peripheral rounded pleural-based mass with destruction of the right fifth rib posterolaterally, pleural thickening, and right hilar mass, better evaluated on prior CT of 01/19/2024. 4. Stable diffuse interstitial coarsening. 5. Aortic atherosclerotic calcification. Electronically signed by: Dorethia Molt MD 01/25/2024 11:11 PM EST RP Workstation: [id number]  CT Chest W Contrast Result Date: 01/19/2024 EXAM: CT CHEST WITH CONTRAST 01/19/2024 08:03:37 PM TECHNIQUE: CT of the chest was performed with the administration of 75 mL of iohexol  (OMNIPAQUE ) 350 MG/ML injection. Multiplanar reformatted images are provided for review. Automated exposure control, iterative reconstruction, and/or weight based adjustment of the mA/kV was utilized to reduce the radiation dose to as low as reasonably achievable. COMPARISON: Same day x-ray and CT 07/07/2016. CLINICAL HISTORY: Abnormal chest x-ray, history of lung cancer. FINDINGS: MEDIASTINUM: Aortic and coronary artery atherosclerotic calcification. The central airways are clear. 2.6 cm right thyroid nodule increased in size from 2018. LYMPH NODES: Large subcarinal lymph node measuring 15 mm on series 3 image 73. Enlarged pretracheal lymph node on series 3 image 56 measuring 13 mm. LUNGS AND PLEURA: Emphysema. Biapical pleuroparenchymal scarring. Cystic mass with irregular wall thickening in the right upper posterior chest with destruction of the right posterior 5th and 6th ribs. This measures 5.9 x 4.3 cm. Right hilar mass measures 4.2 x 5.2 cm. The mass causes severe narrowing, near occlusion, of the right middle lobe pulmonary artery. Additional mild to moderate narrowing of the right upper and lower lobe pulmonary arteries. There is compression or invasion of the right lower and middle lobe bronchi, which are completely occluded. Bronchial wall thickening with debris in the bilateral  lower lobes. Ground glass and tree-in-bud opacities in the lower lungs. Multiple nodules bilaterally suspicious for metastases. For example, in the right upper lobe on series 3 image 48 measuring 14 mm; the right lower lobe on series 3 image 88, measuring 6 mm; in the right lower lobe on series 3 image 74, measuring 9 mm; in the lingula on series 3, image 100 measuring 6 mm; and in the left lower lobe on series 3, image 77, measuring 14 mm. Trace right pleural effusion. Pneumothorax. SOFT TISSUES/BONES: Destruction of the right posterior 5th and 6th ribs associated with the right upper posterior chest mass. Numerous age-indeterminate compression fractures of thoracic vertebral bodies including T3, T4, T7 , T11, T12, and L1 . UPPER ABDOMEN: Partially visualized hypoattenuating lesion in the right hepatic lobe on series 3 image 157 is indeterminate. Metastasis not excluded. IMPRESSION: 1. 5.2 cm right hilar mass with severe narrowing of the right middle lobe pulmonary artery and additional mild to moderate narrowing of the right upper and lower lobe pulmonary arteries. Compression or invasion of the right lower and middle lobe bronchi, which are completely occluded. 2. 5.9 cm cyst mass with irregular margins and wall thickening in the right upper posterior chest with destruction of the right posterior 5th rib, measuring 9 x 4.3 cm. 3. Bilateral pulmonary metastases and mediastinal nodal metastases. 4. Partially visualized indeterminate hypoattenuating right hepatic lobe lesion; metastatic disease is not excluded. 5. Ground glass and tree-in-bud opacities in the lower lungs, likely due to aspiration and/or small airway infection/inflammation. 6. 2.6 cm right thyroid nodule, increased in size compared to 2018; recommend non-emergent thyroid ultrasound if clinically appropriate given comorbidities. 7. Multiple age indeterminate compression fractures in the thoracic spine. Electronically signed by: Norman Gatlin MD  01/19/2024 08:23 PM EST RP Workstation: [id number]   DG Chest 2 View Result Date: 01/19/2024 EXAM: 2 VIEW(S) XRAY OF THE CHEST 01/19/2024 06:25:00 PM COMPARISON: 06/09/2017 CLINICAL HISTORY: sob, cough, possible aspiration FINDINGS: LUNGS AND PLEURA: 7.7 cm right upper lobe opacity. Small right pleural effusion. Biapical pleural parenchymal scarring. No pneumothorax. HEART AND MEDIASTINUM: Right hilum suspicious for adenopathy. CT of the chest with IV contrast is recommended for  further characterization. Aortic atherosclerotic calcification. BONES AND SOFT TISSUES: Destruction of right anterior 4th rib. IMPRESSION: 1. 7.7 cm right upper lobe opacity with destruction of the right anterior 4th rib, small right pleural effusion, and right hilar fullness suspicious for adenopathy; CT of the chest with IV contrast is recommended for further characterization. Electronically signed by: Norman Gatlin MD 01/19/2024 07:03 PM EST RP Workstation: [id number]    Microbiology Recent Results (from the past 240 hours)  Resp panel by RT-PCR (RSV, Flu A&B, Covid) Anterior Nasal Swab     Status: None   Collection Time: 01/19/24  5:24 PM   Specimen: Anterior Nasal Swab  Result Value Ref Range Status   SARS Coronavirus 2 by RT PCR NEGATIVE NEGATIVE Final   Influenza A by PCR NEGATIVE NEGATIVE Final   Influenza B by PCR NEGATIVE NEGATIVE Final    Comment: (NOTE) The Xpert Xpress SARS-CoV-2/FLU/RSV plus assay is intended as an aid in the diagnosis of influenza from Nasopharyngeal swab specimens and should not be used as a sole basis for treatment. Nasal washings and aspirates are unacceptable for Xpert Xpress SARS-CoV-2/FLU/RSV testing.  Fact Sheet for Patients: bloggercourse.com  Fact Sheet for Healthcare Providers: seriousbroker.it  This test is not yet approved or cleared by the [place]  FDA and has been authorized for detection and/or diagnosis of  SARS-CoV-2 by FDA under an Emergency Use Authorization (EUA). This EUA will remain in effect (meaning this test can be used) for the duration of the COVID-19 declaration under Section 564(b)(1) of the Act, 21 U.S.C. section 360bbb-3(b)(1), unless the authorization is terminated or revoked.     Resp Syncytial Virus by PCR NEGATIVE NEGATIVE Final    Comment: (NOTE) Fact Sheet for Patients: bloggercourse.com  Fact Sheet for Healthcare Providers: seriousbroker.it  This test is not yet approved or cleared by the [place]  FDA and has been authorized for detection and/or diagnosis of SARS-CoV-2 by FDA under an Emergency Use Authorization (EUA). This EUA will remain in effect (meaning this test can be used) for the duration of the COVID-19 declaration under Section 564(b)(1) of the Act, 21 U.S.C. section 360bbb-3(b)(1), unless the authorization is terminated or revoked.  Performed at [facility], [address], [place], [place] [address]   Resp panel by RT-PCR (RSV, Flu A&B, Covid) Anterior Nasal Swab     Status: None   Collection Time: 01/25/24 10:06 PM   Specimen: Anterior Nasal Swab  Result Value Ref Range Status   SARS Coronavirus 2 by RT PCR NEGATIVE NEGATIVE Final    Comment: (NOTE) SARS-CoV-2 target nucleic acids are NOT DETECTED.  The SARS-CoV-2 RNA is generally detectable in upper respiratory specimens during the acute phase of infection. The lowest concentration of SARS-CoV-2 viral copies this assay can detect is 138 copies/mL. A negative result does not preclude SARS-Cov-2 infection and should not be used as the sole basis for treatment or other patient management decisions. A negative result may occur with  improper specimen collection/handling, submission of specimen other than nasopharyngeal swab, presence of viral mutation(s) within the areas targeted by this assay, and inadequate number of  viral copies(<138 copies/mL). A negative result must be combined with clinical observations, patient history, and epidemiological information. The expected result is Negative.  Fact Sheet for Patients:  bloggercourse.com  Fact Sheet for Healthcare Providers:  seriousbroker.it  This test is no t yet approved or cleared by the [place]  FDA and  has been authorized for detection and/or diagnosis of SARS-CoV-2 by FDA under  an Emergency Use Authorization (EUA). This EUA will remain  in effect (meaning this test can be used) for the duration of the COVID-19 declaration under Section 564(b)(1) of the Act, 21 U.S.C.section 360bbb-3(b)(1), unless the authorization is terminated  or revoked sooner.       Influenza A by PCR NEGATIVE NEGATIVE Final   Influenza B by PCR NEGATIVE NEGATIVE Final    Comment: (NOTE) The Xpert Xpress SARS-CoV-2/FLU/RSV plus assay is intended as an aid in the diagnosis of influenza from Nasopharyngeal swab specimens and should not be used as a sole basis for treatment. Nasal washings and aspirates are unacceptable for Xpert Xpress SARS-CoV-2/FLU/RSV testing.  Fact Sheet for Patients: bloggercourse.com  Fact Sheet for Healthcare Providers: seriousbroker.it  This test is not yet approved or cleared by the [place]  FDA and has been authorized for detection and/or diagnosis of SARS-CoV-2 by FDA under an Emergency Use Authorization (EUA). This EUA will remain in effect (meaning this test can be used) for the duration of the COVID-19 declaration under Section 564(b)(1) of the Act, 21 U.S.C. section 360bbb-3(b)(1), unless the authorization is terminated or revoked.     Resp Syncytial Virus by PCR NEGATIVE NEGATIVE Final    Comment: (NOTE) Fact Sheet for Patients: bloggercourse.com  Fact Sheet for Healthcare  Providers: seriousbroker.it  This test is not yet approved or cleared by the [place]  FDA and has been authorized for detection and/or diagnosis of SARS-CoV-2 by FDA under an Emergency Use Authorization (EUA). This EUA will remain in effect (meaning this test can be used) for the duration of the COVID-19 declaration under Section 564(b)(1) of the Act, 21 U.S.C. section 360bbb-3(b)(1), unless the authorization is terminated or revoked.  Performed at [facility], [address], [place], [place] [address]   Blood Culture (routine x 2)     Status: None (Preliminary result)   Collection Time: 01/25/24 10:30 PM   Specimen: BLOOD RIGHT FOREARM  Result Value Ref Range Status   Specimen Description   Final    BLOOD RIGHT FOREARM Performed at [facility], [address], [place], [place] [address]    Special Requests   Final    BOTTLES DRAWN AEROBIC AND ANAEROBIC Blood Culture adequate volume Performed at [facility], [address], [place], [place] [address]    Culture   Final    NO GROWTH 2 DAYS Performed at [facility], [address] [place], [place] [address]    Report Status PENDING  Incomplete  Blood Culture (routine x 2)     Status: None (Preliminary result)   Collection Time: 01/25/24 11:09 PM   Specimen: BLOOD LEFT HAND  Result Value Ref Range Status   Specimen Description   Final    BLOOD LEFT HAND Performed at [facility], [address] [place], [place] [address]    Special Requests   Final    BOTTLES DRAWN AEROBIC AND ANAEROBIC Blood Culture adequate volume Performed at [facility], [address] [place], [place] [address]    Culture   Final    NO GROWTH 2 DAYS Performed at [facility], [address], [place], [place] [address]    Report Status PENDING  Incomplete  MRSA Next Gen by PCR, Nasal     Status: None    Collection Time: 01/26/24  2:41 AM   Specimen: Nasal Mucosa; Nasal Swab  Result Value Ref Range Status   MRSA by PCR  Next Gen NOT DETECTED NOT DETECTED Final    Comment: (NOTE) The GeneXpert MRSA Assay (FDA approved for NASAL specimens only), is one component of a comprehensive MRSA colonization surveillance program. It is not intended to diagnose MRSA infection nor to guide or monitor treatment for MRSA infections. Test performance is not FDA approved in patients less than 58 years old. Performed at [facility], [address] [place], [place] [address]     Lab Basic Metabolic Panel: Recent Labs  Lab 01/25/24 2134 01/26/24 0250 01/26/24 0251 01/27/24 0337 Feb 10, 2024 0754  NA 145  --  143 138 145  K 3.5  --  4.4 3.0* 3.7  CL 105  --  108 104 109  CO2 30  --  25 27 23   GLUCOSE 233*  --  88 376* 86  BUN 18  --  18 10 16   CREATININE 0.67 0.52 0.52 0.51 0.62  CALCIUM 10.1  --  9.2 8.3* 9.9  MG  --   --  2.7* 1.7 2.3  PHOS  --   --  4.7* 2.5 1.4*   Liver Function Tests: Recent Labs  Lab 01/25/24 2134 02/10/2024 0754  AST 32 30  ALT 38 21  ALKPHOS 185* 134*  BILITOT 0.2 0.7  PROT 7.8 6.1*  ALBUMIN  4.0 3.9   No results for input(s): LIPASE, AMYLASE in the last 168 hours. No results for input(s): AMMONIA in the last 168 hours. CBC: Recent Labs  Lab 01/25/24 2134 01/26/24 0250 01/27/24 0337  WBC 24.1* 13.6* 15.5*  NEUTROABS 16.3*  --  13.7*  HGB 13.2 12.3 8.9*  HCT 43.2 40.5 29.0*  MCV 93.9 94.2 93.5  PLT 403* 326 215   Cardiac Enzymes: No results for input(s): CKTOTAL, CKMB, CKMBINDEX, TROPONINI in the last 168 hours. Sepsis Labs: Recent Labs  Lab 01/25/24 2134 01/25/24 2317 01/26/24 0250 01/26/24 0351 01/26/24 0715 01/27/24 0337  WBC 24.1*  --  13.6*  --   --  15.5*  LATICACIDVEN  --  2.0*  --  1.8 2.8*  --     Procedures/Operations  Endotracheal Intubation   Dorn KATHEE Chill 02/10/24, 4:33 PM   "

## 2024-01-31 DEATH — deceased

## 2024-02-11 LAB — GLUCOSE, CAPILLARY
Glucose-Capillary: 112 mg/dL — ABNORMAL HIGH (ref 70–99)
Glucose-Capillary: 116 mg/dL — ABNORMAL HIGH (ref 70–99)
Glucose-Capillary: 96 mg/dL (ref 70–99)

## 2024-02-21 ENCOUNTER — Ambulatory Visit: Admitting: Diagnostic Neuroimaging
